# Patient Record
Sex: Male | Born: 1985 | Race: Black or African American | Hispanic: No | Smoking: Current every day smoker
Health system: Southern US, Community
[De-identification: ages and names within clinical notes are randomized; demographics above are authoritative.]

## PROBLEM LIST (undated history)

## (undated) DIAGNOSIS — S022XXA Fracture of nasal bones, initial encounter for closed fracture: Secondary | ICD-10-CM

## (undated) DIAGNOSIS — S02402A Zygomatic fracture, unspecified, initial encounter for closed fracture: Secondary | ICD-10-CM

## (undated) HISTORY — PX: WISDOM TOOTH EXTRACTION: SHX21

## (undated) HISTORY — PX: TENDON REPAIR: SHX5111

---

## 2011-08-13 ENCOUNTER — Encounter (HOSPITAL_COMMUNITY): Payer: Self-pay | Admitting: *Deleted

## 2011-08-13 ENCOUNTER — Emergency Department (HOSPITAL_COMMUNITY)
Admission: EM | Admit: 2011-08-13 | Discharge: 2011-08-13 | Disposition: A | Discharge status: Home / Self Care | Payer: Self-pay | Attending: Emergency Medicine | Admitting: Emergency Medicine

## 2011-08-13 DIAGNOSIS — K047 Periapical abscess without sinus: Secondary | ICD-10-CM

## 2011-08-13 DIAGNOSIS — K044 Acute apical periodontitis of pulpal origin: Secondary | ICD-10-CM | POA: Insufficient documentation

## 2011-08-13 DIAGNOSIS — K089 Disorder of teeth and supporting structures, unspecified: Secondary | ICD-10-CM | POA: Insufficient documentation

## 2011-08-13 DIAGNOSIS — K029 Dental caries, unspecified: Secondary | ICD-10-CM | POA: Insufficient documentation

## 2011-08-13 MED ORDER — PENICILLIN V POTASSIUM 500 MG PO TABS: 500.0000 mg | ORAL_TABLET | Freq: Four times a day (QID) | ORAL | Status: DC

## 2011-08-13 MED ORDER — HYDROCODONE-ACETAMINOPHEN 5-325 MG PO TABS: 1.0000 | ORAL_TABLET | ORAL | Status: DC | PRN

## 2011-08-13 NOTE — ED Notes (Signed)
Dr Alto Denver made aware that pt is in cdu#9.

## 2011-08-13 NOTE — ED Provider Notes (Signed)
History     CSN: [id number]  Arrival date & time 08/13/11  1415   First MD Initiated Contact with Patient 08/13/11 1451      Chief Complaint  Patient presents with  . Dental Pain    (Consider location/radiation/quality/duration/timing/severity/associated sxs/prior treatment) Patient is a 26 y.o. male presenting with tooth pain. The history is provided by the patient.  Dental PainPrimary symptoms do not include headaches, fever or sore throat.  Additional symptoms include: facial swelling. Additional symptoms do not include: trouble swallowing, drooling and ear pain.  Pt with pain to left lower dentition for approx 1 week with no known dental injury. This morning, reports acutely worsened pain with left sided facial swelling. Denies fever, HA, ear pain, throat swelling, neck stiffness, nausea, or vomiting. Pain worse with chewing, no alleviating factors. No prior treatment. Last dental exam several years ago.  History reviewed. No pertinent past medical history.  History reviewed. No pertinent past surgical history.  History reviewed. No pertinent family history.  History  Substance Use Topics  . Smoking status: Current Everyday Smoker    Types: Cigarettes  . Smokeless tobacco: Not on file  . Alcohol Use: Yes     occ      Review of Systems  Constitutional: Negative for fever and chills.  HENT: Positive for facial swelling, neck pain and dental problem. Negative for ear pain, sore throat, drooling, trouble swallowing, neck stiffness and voice change.   Eyes: Negative for pain.  Cardiovascular: Negative for chest pain.  Gastrointestinal: Negative for nausea and vomiting.  Skin: Negative for color change and rash.  Neurological: Negative for headaches.    Allergies  Review of patient's allergies indicates no known allergies.  Home Medications   Current Outpatient Rx  Name Route Sig Dispense Refill  . ADVIL PO Oral Take 10 tablets by mouth once.      BP 125/66   Pulse 90  Temp(Src) 98.1 F (36.7 C) (Oral)  Resp 18  SpO2 99%  Physical Exam  Nursing note and vitals reviewed. Constitutional: He is oriented to person, place, and time. He appears well-developed and well-nourished. No distress.  HENT:  Head: Normocephalic and atraumatic. No trismus in the jaw.  Right Ear: External ear normal.  Left Ear: External ear normal.  Nose: Nose normal.  Mouth/Throat: Mucous membranes are normal. Dental caries present. No uvula swelling. No oropharyngeal exudate or tonsillar abscesses.    Eyes: Pupils are equal, round, and reactive to light. Right eye exhibits no discharge. Left eye exhibits no discharge.  Neck: Normal range of motion. Neck supple.       Slight tenderness without swelling over left tonsillar node  Cardiovascular: Normal rate and regular rhythm.   Pulmonary/Chest: Effort normal. No respiratory distress.  Abdominal: He exhibits no distension. There is no tenderness.  Musculoskeletal: He exhibits no edema and no tenderness.  Lymphadenopathy:    He has no cervical adenopathy.  Neurological: He is alert and oriented to person, place, and time. No cranial nerve deficit.  Skin: Skin is warm and dry. No erythema.  Psychiatric: He has a normal mood and affect.    ED Course  Procedures (including critical care time)  Labs Reviewed - No data to display No results found.   1. Dental infection       MDM  Dental pain with likely infection. Although subjective facial swelling, none can be appreciated on exam. Pt is non-toxic appearing. No s/s of ludwig angina. Will give rx for PCN and pain  medication. Advised close dental follow-up, discussed risks of not following up. Pt and family voice understanding. Return precautions discussed.        Shaaron Adler, [place] 08/13/11 [phone]

## 2011-08-13 NOTE — Discharge Instructions (Signed)
As we discussed, you should take 400mg  of ibuprofen 3 times each day with food initially for your pain. If you need more medication after that, you can use the hydrocodone-acetaminophen as well. Do not drive, operate machinery, or make legal decisions while taking this pain medication.  Take the antibiotic as directed.  Follow-up closely with a dentist as we discussed. The on-call dentist for today is listed above. If he cannot see you, use the resources provided below to obtain care.   Dental Assistance If the dentist on-call cannot see you, please use the resources below:   Patients with Medicaid: [facility] [id number] [address], [phone], [phone]  If unable to pay, or uninsured, contact [facility] [phone]) or [facility] Department [phone] in [place], [phone] in [facility]) to become qualified for the adult dental clinic  Other Low-Cost Community Dental Services: [facility]- [address] Natasha Bence [place], [place], [address]    [phone], Ext. [phone]    2nd and 4th Thursday of the month at 6:30am    10 clients each day by appointment, can sometimes see walk-in     patients if someone does not show for an appointment [facility]- [address]ins [place], [place], [address]    [phone] [facility] [address] [place], [place], [address]    [phone]  [facility] Health Department- [phone] [facility] Health Department- [phone] [facility] [phone]         Abscessed Tooth A tooth abscess is a collection of infected fluid (pus) from a bacterial infection in the inner part of the tooth (pulp). It usually occurs at the end of the tooth's root.  CAUSES   A very bad cavity (extensive tooth decay).   Trauma to the tooth, such as a broken or chipped tooth, that allows bacteria to enter into the pulp.  SYMPTOMS  Severe pain in and around the  infected tooth.   Swelling and redness around the abscessed tooth or in the mouth or face.   Tenderness.   Pus drainage.   Bad breath.   Bitter taste in the mouth.   Difficulty swallowing.   Difficulty opening the mouth.   Feeling sick to your stomach (nauseous).   Vomiting.   Chills.   Swollen neck glands.  DIAGNOSIS  A medical and dental history will be taken.   An examination will be performed by tapping on the abscessed tooth.   X-rays may be taken of the tooth to identify the abscess.  TREATMENT The goal of treatment is to eliminate the infection.   You may be prescribed antibiotic medicine to stop the infection from spreading.   A root canal may be performed to save the tooth. If the tooth cannot be saved, it may be pulled (extracted) and the abscess may be drained.  HOME CARE INSTRUCTIONS  Only take over-the-counter or prescription medicines for pain, fever, or discomfort as directed by your caregiver.   Do not drive after taking pain medicine (narcotics).   Rinse your mouth (gargle) often with salt water ( tsp salt in 8 oz of warm water) to relieve pain or swelling.   Do not apply heat to the outside of your face.   Return to your dentist for further treatment as directed.  SEEK IMMEDIATE DENTAL CARE IF:  You have a temperature by mouth above 102 F (38.9 C), not controlled by medicine.   You have chills or a  very bad headache.   You have problems breathing or swallowing.   Your have trouble opening your mouth.   You develop swelling in the neck or around the eye.   Your pain is not helped by medicine.   Your pain is getting worse instead of better.  Document Released: 05/08/2005 Document Revised: 04/27/2011 Document Reviewed: 08/16/2010 [facility] Patient Information 2012 [place], [place].

## 2011-08-13 NOTE — ED Notes (Signed)
Reports having left lower toothache with swelling, airway is intact, resp e/u.

## 2011-08-15 NOTE — ED Provider Notes (Signed)
Medical screening examination/treatment/procedure(s) were performed by non-physician practitioner and as supervising physician I was immediately available for consultation/collaboration.  Shamiyah Ngu, MD 08/15/11 0658 

## 2011-08-19 ENCOUNTER — Emergency Department (HOSPITAL_COMMUNITY): Payer: Self-pay

## 2011-08-19 ENCOUNTER — Emergency Department (HOSPITAL_COMMUNITY)
Admission: EM | Admit: 2011-08-19 | Discharge: 2011-08-19 | Disposition: A | Discharge status: Home / Self Care | Payer: Self-pay | Attending: Emergency Medicine | Admitting: Emergency Medicine

## 2011-08-19 ENCOUNTER — Encounter (HOSPITAL_COMMUNITY): Payer: Self-pay | Admitting: Emergency Medicine

## 2011-08-19 DIAGNOSIS — L089 Local infection of the skin and subcutaneous tissue, unspecified: Secondary | ICD-10-CM | POA: Insufficient documentation

## 2011-08-19 DIAGNOSIS — K029 Dental caries, unspecified: Secondary | ICD-10-CM | POA: Insufficient documentation

## 2011-08-19 DIAGNOSIS — R51 Headache: Secondary | ICD-10-CM | POA: Insufficient documentation

## 2011-08-19 DIAGNOSIS — T8140XA Infection following a procedure, unspecified, initial encounter: Secondary | ICD-10-CM

## 2011-08-19 LAB — BASIC METABOLIC PANEL WITH GFR
BUN: 11 mg/dL (ref 6–23)
Chloride: 103 meq/L (ref 96–112)
Creatinine, Ser: 1 mg/dL (ref 0.50–1.35)
GFR calc Af Amer: >90 mL/min (ref >90)

## 2011-08-19 LAB — CBC
HCT: 39.1 % (ref 39.0–52.0)
Hemoglobin: 13.1 g/dL (ref 13.0–17.0)
MCH: 29.8 pg (ref 26.0–34.0)
MCHC: 33.5 g/dL (ref 30.0–36.0)
MCV: 89.1 fL (ref 78.0–100.0)
Platelets: 322 10*3/uL (ref 150–400)
RBC: 4.39 MIL/uL (ref 4.22–5.81)
RDW: 13.6 % (ref 11.5–15.5)
WBC: 14.4 10*3/uL — ABNORMAL HIGH (ref 4.0–10.5)

## 2011-08-19 LAB — DIFFERENTIAL
Basophils Absolute: 0 10*3/uL (ref 0.0–0.1)
Basophils Relative: 0 % (ref 0–1)
Eosinophils Absolute: 0.1 10*3/uL (ref 0.0–0.7)
Eosinophils Relative: 1 % (ref 0–5)
Lymphocytes Relative: 16 % (ref 12–46)
Lymphs Abs: 2.3 10*3/uL (ref 0.7–4.0)
Monocytes Absolute: 2.1 K/uL — ABNORMAL HIGH (ref 0.1–1.0)
Monocytes Relative: 15 % — ABNORMAL HIGH (ref 3–12)
Neutro Abs: 9.8 K/uL — ABNORMAL HIGH (ref 1.7–7.7)
Neutrophils Relative %: 68 % (ref 43–77)

## 2011-08-19 LAB — BASIC METABOLIC PANEL
CO2: 26 mEq/L (ref 19–32)
Calcium: 9.5 mg/dL (ref 8.4–10.5)
GFR calc non Af Amer: >90 mL/min (ref >90)
Glucose, Bld: 89 mg/dL (ref 70–99)
Potassium: 3.8 mEq/L (ref 3.5–5.1)
Sodium: 140 mEq/L (ref 135–145)

## 2011-08-19 MED ORDER — OXYCODONE-ACETAMINOPHEN 5-325 MG PO TABS: 1.0000 | ORAL_TABLET | ORAL | Status: AC | PRN

## 2011-08-19 MED ORDER — IOHEXOL 300 MG/ML  SOLN
80.0000 mL | Freq: Once | INTRAMUSCULAR | Status: AC | PRN
  Administered 2011-08-19: 80 mL via INTRAVENOUS

## 2011-08-19 MED ORDER — MORPHINE SULFATE 4 MG/ML IJ SOLN
4.0000 mg | Freq: Once | INTRAMUSCULAR | Status: AC
  Administered 2011-08-19: 4 mg via INTRAVENOUS
  Filled 2011-08-19: qty 1

## 2011-08-19 MED ORDER — HYDROMORPHONE HCL PF 2 MG/ML IJ SOLN
2.0000 mg | Freq: Once | INTRAMUSCULAR | Status: AC
  Administered 2011-08-19: 2 mg via INTRAMUSCULAR
  Filled 2011-08-19: qty 1

## 2011-08-19 MED ORDER — SODIUM CHLORIDE 0.9 % IV SOLN
Freq: Once | INTRAVENOUS | Status: AC
  Administered 2011-08-19: 14:00:00 via INTRAVENOUS

## 2011-08-19 MED ORDER — CLINDAMYCIN HCL 300 MG PO CAPS: 300.0000 mg | ORAL_CAPSULE | Freq: Four times a day (QID) | ORAL | Status: AC

## 2011-08-19 MED ORDER — HYDROMORPHONE HCL PF 1 MG/ML IJ SOLN
1.0000 mg | Freq: Once | INTRAMUSCULAR | Status: AC
  Administered 2011-08-19: 1 mg via INTRAVENOUS
  Filled 2011-08-19: qty 1

## 2011-08-19 MED ORDER — METRONIDAZOLE 500 MG PO TABS: 500.0000 mg | ORAL_TABLET | Freq: Two times a day (BID) | ORAL | Status: AC

## 2011-08-19 MED ORDER — CLINDAMYCIN PHOSPHATE 900 MG/50ML IV SOLN
900.0000 mg | Freq: Once | INTRAVENOUS | Status: AC
  Administered 2011-08-19: 900 mg via INTRAVENOUS
  Filled 2011-08-19: qty 50

## 2011-08-19 MED ORDER — HYDROMORPHONE HCL PF 1 MG/ML IJ SOLN: 1.0000 mg | Freq: Once | INTRAMUSCULAR | Status: DC

## 2011-08-19 NOTE — ED Provider Notes (Signed)
3:53 PM  Pt is in CDU holding for maxillofacial CT.  Patient with recent tooth extraction, now with swelling and pain along left jaw.  Reports pain with swallowing but no difficulty swallowing or breathing.  Pain currently 9/10.  On exam, pt is A&Ox4, NAD, speaking easily in full sentences. large area of swelling over left mandible, pt unable to open jaw very far for exam, RRR, CTAB no stridor.    5:33 PM Discussed results with Dr Oletta Lamas and with patient.  Per my discussion with Dr Oletta Lamas, plan is for d/c home with clindamycin and flagyl, pain medication, and dental follow up on Monday (Dr Oswaldo Done).    Patient notes that he did smoke following his procedure, despite being told not to by the dentist.  I have advised patient that the antibiotic clindamycin is expensive and patient states that this will not be a problem. Discussed plan with patient including strict return precautions.  Patient verbalizes understanding and agrees with plan.  Patient tolerating his oral secretions without difficulty, drinking in the ED.  Advised to maintain hydration, take medication as directed, and call dentist first thing Monday morning for an appointment that day.  Pt is to return for any worsening condition, especially difficulty swallowing or breathing - pt advised to dial [phone] if this occurs.  Patient verbalizes understanding and agrees with plan.    Dillard Cannon [place], [place] 08/19/11 [phone]

## 2011-08-19 NOTE — ED Notes (Signed)
Pt. Stated, i had my tooth pulled on Tuesday, and I've had facial swelling and pain

## 2011-08-19 NOTE — Discharge Instructions (Signed)
Do not smoke until your dentist gives you permission to resume smoking.  This may cause problems with healing.  Take the medication as prescribed.  Call Dr Oswaldo Done first thing Monday morning to be seen the same day.  Please let the office know the ER doctor spoke with him while you were here to arrange this.  Use the pain medication as needed.  Do not take additional tylenol while taking the prescribed pain medication.  This is to be taken instead of what you were prescribed by your dentist.  Taking both could result in an overdose of tylenol.  Please discuss all medications with your pharmacist.  You may take ibuprofen in addition to the prescribed pain medication.  You may take 800mg  three times daily with meals (800mg  is usually 4 over the counter pills).  Return to the ER immediately if you develop high fevers while on antibiotics, uncontrolled pain, increased swelling that makes it difficult to breath or swallow your own saliva, or you are unable to tolerate fluids by mouth.  You may return to the ER at any time for worsening condition or any new symptoms that concern you.

## 2011-08-19 NOTE — ED Notes (Signed)
Pt reports taking Amoxicillin 500 mg tabs, pt has 2 tabs left

## 2011-08-19 NOTE — ED Provider Notes (Signed)
History    CSN: [id number]  Arrival date & time 08/19/11  1311   First MD Initiated Contact with Patient 08/19/11 1340      Chief Complaint Patient presents with . Oral Swelling   (Consider location/radiation/quality/duration/timing/severity/associated sxs/prior treatment) HPI Comments: Patient apparently was seen in the emergency department on March 24 with dental pain. He had swelling and chills with pain focal to his posterior lower left wisdom tooth region. He was started on penicillin. He followed up with a dentist and had that tooth extracted. He reports that he was switched to amoxicillin and also given a prescription for pain medication and steroids. The patient reports that the swelling and pain was improving however since Thursday, the swelling has gotten much worse and now his entire left cheek and jaw area is extremely swollen and it is difficult to open his mouth. He reports he can't swallow it he leans his head to the right side. He denies shortness of breath. He reports some chills but unsure about actual fevers. He has pain and swelling he reports also underneath his chin more to the left side. No coughing, no nausea or vomiting. He reports a small amount of bleeding after he brushes his teeth in that area.  The history is provided by a friend and the patient.    History reviewed. No pertinent past medical history.  History reviewed. No pertinent past surgical history.  History reviewed. No pertinent family history.  History Substance Use Topics . Smoking status: Not on file . Smokeless tobacco: Not on file . Alcohol Use: 1.2 oz/week   2 Cans of beer per week    per week     Review of Systems  Constitutional: Positive for chills.  HENT: Positive for dental problem. Negative for neck pain and neck stiffness.   Respiratory: Negative for cough and shortness of breath.   Gastrointestinal: Negative for nausea, vomiting and abdominal pain.    Allergies Review of  patient's allergies indicates no known allergies.  Home Medications No current outpatient prescriptions on file.  BP 135/68  Pulse 93  Temp(Src) 98.5 F (36.9 C) (Oral)  Resp 20  SpO2 95%  Physical Exam  Nursing note and vitals reviewed. Constitutional: He appears well-developed and well-nourished.  HENT:  Head: Normocephalic and atraumatic.         Some trismus, able to fit 1.5 fingers in between teeth with mouth opening  Cardiovascular: Normal rate.   Pulmonary/Chest: Effort normal. No stridor. No respiratory distress. He has no wheezes. He has no rales.  Abdominal: Soft.  Lymphadenopathy:    He has no cervical adenopathy.  Neurological: He is alert.  Skin: Skin is warm.    ED Course Procedures (including critical care time)   Labs Reviewed   No results found.   No diagnosis found.    MDM I discussed case with dentist on call for Dr. Oswaldo Done.  He initially thought he could be given oral flagyl as abx, but I further described degree of swelling and he agrees that CT and IV dose of abx is probably needed.  He suggested that oral surgeon may need to be involved in this case pending results.  Will place pt in CDU holding for IV meds, IVF's, and CT of maxillofacial with contrast.  Dr. Chales Salmon is on call for oral surgery.    Gavin Pound. Quinnten Calvin, MD 08/19/11 1517

## 2011-08-21 NOTE — ED Provider Notes (Signed)
Medical screening examination/treatment/procedure(s) were conducted as a shared visit with non-physician practitioner(s) and myself.  I personally evaluated the patient during the encounter  Please see my original H&P.  Gavin Pound. Phillippe Orlick, MD 08/21/11 1734

## 2017-05-28 ENCOUNTER — Emergency Department (HOSPITAL_BASED_OUTPATIENT_CLINIC_OR_DEPARTMENT_OTHER)
Admission: EM | Admit: 2017-05-28 | Discharge: 2017-05-29 | Disposition: A | Discharge status: Home / Self Care | Payer: Self-pay | Attending: Emergency Medicine | Admitting: Emergency Medicine

## 2017-05-28 ENCOUNTER — Other Ambulatory Visit: Payer: Self-pay

## 2017-05-28 ENCOUNTER — Emergency Department (HOSPITAL_BASED_OUTPATIENT_CLINIC_OR_DEPARTMENT_OTHER): Payer: Self-pay

## 2017-05-28 ENCOUNTER — Encounter (HOSPITAL_BASED_OUTPATIENT_CLINIC_OR_DEPARTMENT_OTHER): Payer: Self-pay

## 2017-05-28 DIAGNOSIS — R197 Diarrhea, unspecified: Secondary | ICD-10-CM | POA: Insufficient documentation

## 2017-05-28 DIAGNOSIS — R112 Nausea with vomiting, unspecified: Secondary | ICD-10-CM

## 2017-05-28 DIAGNOSIS — R109 Unspecified abdominal pain: Secondary | ICD-10-CM

## 2017-05-28 DIAGNOSIS — R1111 Vomiting without nausea: Secondary | ICD-10-CM | POA: Insufficient documentation

## 2017-05-28 LAB — COMPREHENSIVE METABOLIC PANEL
ALT: 66 U/L — ABNORMAL HIGH (ref 17–63)
ANION GAP: 8 (ref 5–15)
AST: 31 U/L (ref 15–41)
Albumin: 4.1 g/dL (ref 3.5–5.0)
Alkaline Phosphatase: 43 U/L (ref 38–126)
BUN: 13 mg/dL (ref 6–20)
CHLORIDE: 103 mmol/L (ref 101–111)
CO2: 28 mmol/L (ref 22–32)
CREATININE: 1.2 mg/dL (ref 0.61–1.24)
Calcium: 8.9 mg/dL (ref 8.9–10.3)
GFR calc non Af Amer: >60 mL/min (ref >60)
Glucose, Bld: 94 mg/dL (ref 65–99)
Potassium: 3.6 mmol/L (ref 3.5–5.1)
SODIUM: 139 mmol/L (ref 135–145)
Total Bilirubin: 0.6 mg/dL (ref 0.3–1.2)
Total Protein: 7.6 g/dL (ref 6.5–8.1)

## 2017-05-28 LAB — URINALYSIS, ROUTINE W REFLEX MICROSCOPIC
Bilirubin Urine: NEGATIVE
Glucose, UA: NEGATIVE mg/dL
Ketones, ur: NEGATIVE mg/dL
Leukocytes, UA: NEGATIVE
NITRITE: NEGATIVE
PROTEIN: 30 mg/dL — AB
Specific Gravity, Urine: 1.02 (ref 1.005–1.030)
pH: 6.5 (ref 5.0–8.0)

## 2017-05-28 LAB — CBC WITH DIFFERENTIAL/PLATELET
Basophils Absolute: 0 10*3/uL (ref 0.0–0.1)
Basophils Relative: 0 %
Eosinophils Absolute: 0.3 10*3/uL (ref 0.0–0.7)
Eosinophils Relative: 3 %
HEMATOCRIT: 44.2 % (ref 39.0–52.0)
HEMOGLOBIN: 14.7 g/dL (ref 13.0–17.0)
LYMPHS ABS: 1.5 10*3/uL (ref 0.7–4.0)
LYMPHS PCT: 18 %
MCH: 30 pg (ref 26.0–34.0)
MCHC: 33.3 g/dL (ref 30.0–36.0)
MCV: 90.2 fL (ref 78.0–100.0)
Monocytes Absolute: 1.2 10*3/uL — ABNORMAL HIGH (ref 0.1–1.0)
Monocytes Relative: 14 %
NEUTROS PCT: 65 %
Neutro Abs: 5.6 10*3/uL (ref 1.7–7.7)
Platelets: 199 10*3/uL (ref 150–400)
RBC: 4.9 MIL/uL (ref 4.22–5.81)
RDW: 14 % (ref 11.5–15.5)
WBC: 8.6 10*3/uL (ref 4.0–10.5)

## 2017-05-28 LAB — URINALYSIS, MICROSCOPIC (REFLEX): WBC, UA: NONE SEEN WBC/hpf (ref 0–5)

## 2017-05-28 LAB — LIPASE, BLOOD: Lipase: 20 U/L (ref 11–51)

## 2017-05-28 MED ORDER — ONDANSETRON HCL 4 MG/2ML IJ SOLN
4.0000 mg | Freq: Once | INTRAMUSCULAR | Status: AC
  Administered 2017-05-28: 4 mg via INTRAVENOUS
  Filled 2017-05-28: qty 2

## 2017-05-28 MED ORDER — KETOROLAC TROMETHAMINE 30 MG/ML IJ SOLN
30.0000 mg | Freq: Once | INTRAMUSCULAR | Status: AC
Start: 2017-05-28 — End: 2017-05-28
  Administered 2017-05-28: 30 mg via INTRAVENOUS
  Filled 2017-05-28: qty 1

## 2017-05-28 MED ORDER — SODIUM CHLORIDE 0.9 % IV BOLUS (SEPSIS)
1000.0000 mL | Freq: Once | INTRAVENOUS | Status: AC
  Administered 2017-05-28: 1000 mL via INTRAVENOUS

## 2017-05-28 NOTE — ED Provider Notes (Signed)
[facility] EMERGENCY DEPARTMENT Provider Note   CSN: [id number] Arrival date & time: 05/28/17  1723     History   Chief Complaint Chief Complaint  Patient presents with  . Emesis    HPI Jonathon Mendez is a 32 y.o. male who presents with 4 days of generalized abdominal pain, generalized malaise, nausea/vomiting and diarrhea.  Patient reports that since onset of symptoms, he has had multiple episodes of nonbloody, nonbilious vomiting.  He reports that today he only had one.  He reports decreased appetite secondary to symptoms.  Patient reports additionally he has had some episodes of diarrhea.  He denies any blood present and diarrhea.  He has not had any episodes today.  Patient reports that he felt febrile yesterday and measured a temp of 101.0.  He reports that fever has improved and he has not been febrile since yesterday.  Patient reports that abdominal pain is generalized.  He describes it as a dull ache.  He denies any alleviating or aggravating factors.  Patient denies any sore throat, chest pain, difficulty breathing, dysuria, hematuria.   The history is provided by the patient.    History reviewed. No pertinent past medical history.  There are no active problems to display for this patient.   Past Surgical History:  Procedure Laterality Date  . WISDOM TOOTH EXTRACTION         Home Medications    Prior to Admission medications   Medication Sig Start Date End Date Taking? Authorizing Provider  dicyclomine (BENTYL) 20 MG tablet Take 1 tablet (20 mg total) by mouth 2 (two) times daily. 05/29/17   Maxwell Caul, PA-C  ondansetron (ZOFRAN) 4 MG tablet Take 1 tablet (4 mg total) by mouth every 6 (six) hours. 05/29/17   Maxwell Caul, PA-C    Family History No family history on file.  Social History Social History   Tobacco Use  . Smoking status: Current Every Day Smoker    Packs/day: 0.50    Types: Cigarettes  . Smokeless tobacco: Never Used    Substance Use Topics  . Alcohol use: Yes    Comment: occ  . Drug use: No     Allergies   Patient has no known allergies.   Review of Systems Review of Systems  Constitutional: Positive for fever (Resolved).  Respiratory: Negative for cough and shortness of breath.   Cardiovascular: Negative for chest pain.  Gastrointestinal: Positive for abdominal pain, diarrhea, nausea and vomiting.  Genitourinary: Negative for dysuria and hematuria.  Neurological: Negative for headaches.     Physical Exam Updated Vital Signs BP (!) 106/57 (BP Location: Right Arm)   Pulse 70   Temp 98.2 F (36.8 C) (Oral)   Resp 16   Ht 5\' 9"  (1.753 m)   Wt 79.4 kg (175 lb)   SpO2 100%   BMI 25.84 kg/m   Physical Exam  Constitutional: He is oriented to person, place, and time. He appears well-developed and well-nourished.  Sitting comfortably on examination table  HENT:  Head: Normocephalic and atraumatic.  Mouth/Throat: Oropharynx is clear and moist and mucous membranes are normal.  Eyes: Conjunctivae, EOM and lids are normal. Pupils are equal, round, and reactive to light.  Neck: Full passive range of motion without pain.  Cardiovascular: Normal rate, regular rhythm, normal heart sounds and normal pulses. Exam reveals no gallop and no friction rub.  No murmur heard. Pulmonary/Chest: Effort normal and breath sounds normal.  No evidence of respiratory distress. Able to  speak in full sentences without difficulty.  Abdominal: Soft. Normal appearance. There is generalized tenderness. There is no rigidity, no guarding, no CVA tenderness and no tenderness at McBurney's point. Hernia confirmed negative in the right inguinal area and confirmed negative in the left inguinal area.  Abdomen is soft, nondistended.  Patient does have diffuse generalized tenderness.  Patient does have some CVA tenderness, most notably on the right.  No rigidity or guarding.  No peritoneal signs.  No tenderness to McBurney's  point.  Genitourinary: Testes normal and penis normal. Right testis shows no swelling and no tenderness. Left testis shows no swelling and no tenderness. Circumcised.  Genitourinary Comments: The exam was performed with a chaperone present. Normal male genitalia. No evidence of rash, ulcers or lesions.   Musculoskeletal: Normal range of motion.  Neurological: He is alert and oriented to person, place, and time.  Skin: Skin is warm and dry. Capillary refill takes less than 2 seconds.  Psychiatric: He has a normal mood and affect. His speech is normal.  Nursing note and vitals reviewed.    ED Treatments / Results  Labs (all labs ordered are listed, but only abnormal results are displayed) Labs Reviewed  URINALYSIS, ROUTINE W REFLEX MICROSCOPIC - Abnormal; Notable for the following components:      Result Value   Hgb urine dipstick MODERATE (*)    Protein, ur 30 (*)    All other components within normal limits  COMPREHENSIVE METABOLIC PANEL - Abnormal; Notable for the following components:   ALT 66 (*)    All other components within normal limits  CBC WITH DIFFERENTIAL/PLATELET - Abnormal; Notable for the following components:   Monocytes Absolute 1.2 (*)    All other components within normal limits  URINALYSIS, MICROSCOPIC (REFLEX) - Abnormal; Notable for the following components:   Bacteria, UA RARE (*)    Squamous Epithelial / LPF 0-5 (*)    All other components within normal limits  LIPASE, BLOOD    EKG  EKG Interpretation None       Radiology Ct Renal Stone Study  Result Date: 05/29/2017 CLINICAL DATA:  Bilateral flank pain. EXAM: CT ABDOMEN AND PELVIS WITHOUT CONTRAST TECHNIQUE: Multidetector CT imaging of the abdomen and pelvis was performed following the standard protocol without IV contrast. COMPARISON:  None. FINDINGS: Lower chest: Lung bases are clear. Hepatobiliary: No focal hepatic lesion allowing for lack contrast. Gallbladder partially distended, no calcified  stone or pericholecystic inflammation. No biliary dilatation. Pancreas: No ductal dilatation or inflammation. Spleen: Normal in size without focal abnormality. Adrenals/Urinary Tract: Normal adrenal glands. No hydronephrosis. No urolithiasis. Both ureters are decompressed without stones along the course. No perinephric edema. Urinary bladder near completely decompressed, no bladder stone. Stomach/Bowel: Stomach is within normal limits. Appendix appears normal. No evidence of bowel wall thickening, distention, or inflammatory changes. Vascular/Lymphatic: Normal caliber abdominal aorta. No enlarged abdominal or pelvic lymph nodes. Reproductive: Prostate is unremarkable. Other: Punctate left pelvic calcification is a phlebolith. No free air. No ascites. Musculoskeletal: There are no acute or suspicious osseous abnormalities. IMPRESSION: No renal stones or obstructive uropathy. No acute abnormality or explanation for flank pain. Electronically Signed   By: Rubye OaksMelanie  Ehinger M.D.   On: 05/29/2017 00:00    Procedures Procedures (including critical care time)  Medications Ordered in ED Medications  dicyclomine (BENTYL) capsule 10 mg (not administered)  sodium chloride 0.9 % bolus 1,000 mL (0 mLs Intravenous Stopped 05/28/17 2243)  ondansetron (ZOFRAN) injection 4 mg (4 mg Intravenous Given 05/28/17 2159)  ketorolac (TORADOL) 30 MG/ML injection 30 mg (30 mg Intravenous Given 05/28/17 2245)     Initial Impression / Assessment and Plan / ED Course  I have reviewed the triage vital signs and the nursing notes.  Pertinent labs & imaging results that were available during my care of the patient were reviewed by me and considered in my medical decision making (see chart for details).     32 y.o. M who presents for evaluation of abdominal pain, nausea/vomiting and diarrhea.  Emesis is nonbloody, nonbilious.  Diarrhea is nonbloody, nonbilious.  Patient reports decreased appetite secondary to symptoms. Patient is  afebrile, non-toxic appearing, sitting comfortably on examination table. Vital signs reviewed and stable.  Physical exam shows diffuse tenderness palpation to the abdomen.  No focal point.  No peritoneal signs.  Normal GU exam.  Consider acute infectious etiology.  Patient does have some CVA tenderness.  Will obtain urine before deciding on imaging.  History/physical exam not concerning for diverticulitis or appendicitis.  History/physical exam not concerning for testicular torsion.  IVF given for fluid resuscitation. Analgesics provided in the department. Plan to check basic labs.   Labs reviewed.  Lipase unremarkable.  ALT has slight bump.  Urinalysis negative for any acute infectious signs does have some hemoglobin.  CBC unremarkable.  Given the patient has hemoglobin in the urine and did have some CVA tenderness, will plan for CT renal scan for evaluation of kidney stone.  Reevaluation.  Patient reports improvement of pain after analgesics.  Will reassess.  CT renal stone study negative for any acute kidney stones.  Appendix is normal.  No other acute abnormalities.  Discussed results with patient.  Plan to p.o. challenge patient the department.  Patient did not tolerate p.o. without any difficulty.  Repeat abdominal exam shows improved tenderness.  No peritoneal signs noted.  Exam is not concerning for appendicitis or reticulitis.  Vital signs are stable.  Likely due to viral gastro-process.  Plan to send patient home with antiemetics.  Patient instructed follow-up primary care doctor next 24-48 hours for further evaluation. Patient had ample opportunity for questions and discussion. All patient's questions were answered with full understanding. Strict return precautions discussed. Patient expresses understanding and agreement to plan.    Final Clinical Impressions(s) / ED Diagnoses   Final diagnoses:  Nausea vomiting and diarrhea  Abdominal pain, unspecified abdominal location    ED Discharge  Orders        Ordered    ondansetron (ZOFRAN) 4 MG tablet  Every 6 hours     05/29/17 0017    dicyclomine (BENTYL) 20 MG tablet  2 times daily     05/29/17 0019       Maxwell Caul, PA-C 05/29/17 Dola Factor, MD 05/29/17 1807

## 2017-05-28 NOTE — ED Notes (Signed)
Pt c/o flu symptoms since yesterday, has not had any OTC meds for symptoms.  Pt has not vomited or had diarrhea since arrival at the ED.

## 2017-05-28 NOTE — ED Triage Notes (Signed)
C/o n/v/d, fever,body aches-started yesterday-NAD-steady gait

## 2017-05-28 NOTE — ED Notes (Signed)
EDP is now at bedside 

## 2017-05-28 NOTE — ED Notes (Signed)
Patient transported to CT 

## 2017-05-29 MED ORDER — DICYCLOMINE HCL 20 MG PO TABS: 20.0000 mg | ORAL_TABLET | Freq: Two times a day (BID) | ORAL | 0 refills | Status: DC

## 2017-05-29 MED ORDER — DICYCLOMINE HCL 10 MG PO CAPS
10.0000 mg | ORAL_CAPSULE | Freq: Once | ORAL | Status: AC
  Administered 2017-05-29: 10 mg via ORAL
  Filled 2017-05-29: qty 1

## 2017-05-29 MED ORDER — ONDANSETRON HCL 4 MG PO TABS: 4.0000 mg | ORAL_TABLET | Freq: Four times a day (QID) | ORAL | 0 refills | Status: DC

## 2017-05-29 NOTE — Discharge Instructions (Signed)
Take zofran as directed for nausea.   Take bentyl as needed for pain.  Make sure you are drinking plenty of fluids and staying hydrated.  Follow-up with your primary care doctor in the next 24-48 hours for further evaluation.  Return to the emergency department for any worsening abdominal pain, fever, persistent vomiting despite medications, pain with urination, chest pain or difficulty breathing or any other worsening or concerning symptoms.

## 2017-05-29 NOTE — ED Notes (Signed)
Pt tolerated ginger ale, states he feels better

## 2017-05-29 NOTE — ED Notes (Signed)
Pt verbalizes understanding of d/c instructions and denies any further needs at this time. 

## 2017-08-22 ENCOUNTER — Encounter (HOSPITAL_BASED_OUTPATIENT_CLINIC_OR_DEPARTMENT_OTHER): Payer: Self-pay

## 2017-08-22 ENCOUNTER — Emergency Department (HOSPITAL_BASED_OUTPATIENT_CLINIC_OR_DEPARTMENT_OTHER): Payer: Self-pay

## 2017-08-22 ENCOUNTER — Other Ambulatory Visit: Payer: Self-pay

## 2017-08-22 ENCOUNTER — Emergency Department (HOSPITAL_BASED_OUTPATIENT_CLINIC_OR_DEPARTMENT_OTHER)
Admission: EM | Admit: 2017-08-22 | Discharge: 2017-08-22 | Disposition: A | Discharge status: Home / Self Care | Payer: Self-pay | Attending: Emergency Medicine | Admitting: Emergency Medicine

## 2017-08-22 DIAGNOSIS — S0240FA Zygomatic fracture, left side, initial encounter for closed fracture: Secondary | ICD-10-CM | POA: Insufficient documentation

## 2017-08-22 DIAGNOSIS — Y939 Activity, unspecified: Secondary | ICD-10-CM | POA: Insufficient documentation

## 2017-08-22 DIAGNOSIS — Y999 Unspecified external cause status: Secondary | ICD-10-CM | POA: Insufficient documentation

## 2017-08-22 DIAGNOSIS — Y929 Unspecified place or not applicable: Secondary | ICD-10-CM | POA: Insufficient documentation

## 2017-08-22 DIAGNOSIS — Z79899 Other long term (current) drug therapy: Secondary | ICD-10-CM | POA: Insufficient documentation

## 2017-08-22 DIAGNOSIS — S02402A Zygomatic fracture, unspecified, initial encounter for closed fracture: Secondary | ICD-10-CM

## 2017-08-22 DIAGNOSIS — F17210 Nicotine dependence, cigarettes, uncomplicated: Secondary | ICD-10-CM | POA: Insufficient documentation

## 2017-08-22 DIAGNOSIS — S022XXA Fracture of nasal bones, initial encounter for closed fracture: Secondary | ICD-10-CM

## 2017-08-22 HISTORY — DX: Fracture of nasal bones, initial encounter for closed fracture: S02.2XXA

## 2017-08-22 HISTORY — DX: Zygomatic fracture, unspecified side, initial encounter for closed fracture: S02.402A

## 2017-08-22 MED ORDER — OXYCODONE HCL 5 MG PO TABS
5.0000 mg | ORAL_TABLET | Freq: Once | ORAL | Status: AC
  Administered 2017-08-22: 5 mg via ORAL
  Filled 2017-08-22: qty 1

## 2017-08-22 MED ORDER — IBUPROFEN 800 MG PO TABS
800.0000 mg | ORAL_TABLET | Freq: Once | ORAL | Status: AC
  Administered 2017-08-22: 800 mg via ORAL
  Filled 2017-08-22: qty 1

## 2017-08-22 MED ORDER — TETANUS-DIPHTH-ACELL PERTUSSIS 5-2.5-18.5 LF-MCG/0.5 IM SUSP
0.5000 mL | Freq: Once | INTRAMUSCULAR | Status: AC
  Administered 2017-08-22: 0.5 mL via INTRAMUSCULAR
  Filled 2017-08-22: qty 0.5

## 2017-08-22 MED ORDER — MORPHINE SULFATE 15 MG PO TABS: 15.0000 mg | ORAL_TABLET | ORAL | 0 refills | Status: DC | PRN

## 2017-08-22 MED ORDER — ACETAMINOPHEN 500 MG PO TABS
1000.0000 mg | ORAL_TABLET | Freq: Once | ORAL | Status: AC
  Administered 2017-08-22: 1000 mg via ORAL
  Filled 2017-08-22: qty 2
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
## 2017-08-30 ENCOUNTER — Other Ambulatory Visit: Payer: Self-pay

## 2017-08-30 ENCOUNTER — Encounter (HOSPITAL_BASED_OUTPATIENT_CLINIC_OR_DEPARTMENT_OTHER): Payer: Self-pay | Admitting: *Deleted

## 2017-08-31 ENCOUNTER — Ambulatory Visit: Payer: Self-pay | Admitting: Oral Surgery

## 2017-09-02 ENCOUNTER — Encounter (HOSPITAL_BASED_OUTPATIENT_CLINIC_OR_DEPARTMENT_OTHER): Payer: Self-pay | Admitting: Anesthesiology

## 2017-09-02 NOTE — Anesthesia Preprocedure Evaluation (Deleted)
Anesthesia Evaluation  Patient identified by MRN, date of birth, ID band Patient awake    Reviewed: Allergy & Precautions, H&P , Patient's Chart, lab work & pertinent test results, reviewed documented beta blocker date and time   Airway Mallampati: II  TM Distance: >3 FB Neck ROM: full    Dental no notable dental hx.    Pulmonary Current Smoker,    Pulmonary exam normal breath sounds clear to auscultation       Cardiovascular  Rhythm:regular Rate:Normal     Neuro/Psych    GI/Hepatic   Endo/Other    Renal/GU      Musculoskeletal   Abdominal   Peds  Hematology   Anesthesia Other Findings   Reproductive/Obstetrics                             Anesthesia Physical Anesthesia Plan  ASA: II  Anesthesia Plan: General   Post-op Pain Management:    Induction: Intravenous  PONV Risk Score and Plan:   Airway Management Planned: Oral ETT  Additional Equipment:   Intra-op Plan:   Post-operative Plan: Extubation in OR  Informed Consent: I have reviewed the patients History and Physical, chart, labs and discussed the procedure including the risks, benefits and alternatives for the proposed anesthesia with the patient or authorized representative who has indicated his/her understanding and acceptance.   Dental Advisory Given  Plan Discussed with: CRNA and Surgeon  Anesthesia Plan Comments: (  )        Anesthesia Quick Evaluation  

## 2017-09-03 ENCOUNTER — Ambulatory Visit (HOSPITAL_BASED_OUTPATIENT_CLINIC_OR_DEPARTMENT_OTHER): Admission: RE | Admit: 2017-09-03 | Payer: Self-pay | Source: Ambulatory Visit | Admitting: Oral Surgery

## 2017-09-03 HISTORY — DX: Zygomatic fracture, unspecified side, initial encounter for closed fracture: S02.402A

## 2017-09-03 HISTORY — DX: Fracture of nasal bones, initial encounter for closed fracture: S02.2XXA

## 2017-09-03 SURGERY — OPEN REDUCTION INTERNAL FIXATION (ORIF) FACIAL FRACTURE
Anesthesia: General | Laterality: Left

## 2017-09-03 NOTE — H&P (Signed)
Anesthesia H&P Update: History and Physical Exam reviewed; patient is OK for planned anesthetic and procedure. ? ?
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
## 2017-12-05 ENCOUNTER — Ambulatory Visit
Admission: RE | Admit: 2017-12-05 | Discharge: 2017-12-05 | Disposition: A | Discharge status: Home / Self Care | Payer: Self-pay | Source: Ambulatory Visit | Attending: Nurse Practitioner | Admitting: Nurse Practitioner

## 2017-12-05 ENCOUNTER — Other Ambulatory Visit: Payer: Self-pay | Admitting: Nurse Practitioner

## 2017-12-05 DIAGNOSIS — M545 Low back pain: Secondary | ICD-10-CM

## 2018-05-31 ENCOUNTER — Other Ambulatory Visit: Payer: Self-pay

## 2018-05-31 ENCOUNTER — Emergency Department (HOSPITAL_BASED_OUTPATIENT_CLINIC_OR_DEPARTMENT_OTHER)
Admission: EM | Admit: 2018-05-31 | Discharge: 2018-05-31 | Disposition: A | Discharge status: Home / Self Care | Payer: 59 | Attending: Emergency Medicine | Admitting: Emergency Medicine

## 2018-05-31 ENCOUNTER — Encounter (HOSPITAL_BASED_OUTPATIENT_CLINIC_OR_DEPARTMENT_OTHER): Payer: Self-pay | Admitting: *Deleted

## 2018-05-31 ENCOUNTER — Emergency Department (HOSPITAL_BASED_OUTPATIENT_CLINIC_OR_DEPARTMENT_OTHER): Payer: 59

## 2018-05-31 DIAGNOSIS — Y9389 Activity, other specified: Secondary | ICD-10-CM | POA: Diagnosis not present

## 2018-05-31 DIAGNOSIS — S8992XA Unspecified injury of left lower leg, initial encounter: Secondary | ICD-10-CM

## 2018-05-31 DIAGNOSIS — Y9289 Other specified places as the place of occurrence of the external cause: Secondary | ICD-10-CM | POA: Insufficient documentation

## 2018-05-31 DIAGNOSIS — F17210 Nicotine dependence, cigarettes, uncomplicated: Secondary | ICD-10-CM | POA: Diagnosis not present

## 2018-05-31 DIAGNOSIS — W010XXA Fall on same level from slipping, tripping and stumbling without subsequent striking against object, initial encounter: Secondary | ICD-10-CM | POA: Insufficient documentation

## 2018-05-31 DIAGNOSIS — Y998 Other external cause status: Secondary | ICD-10-CM | POA: Diagnosis not present

## 2018-05-31 MED ORDER — IBUPROFEN 600 MG PO TABS: 600.0000 mg | ORAL_TABLET | Freq: Three times a day (TID) | ORAL | 0 refills | Status: DC | PRN

## 2018-05-31 MED ORDER — IBUPROFEN 400 MG PO TABS
400.0000 mg | ORAL_TABLET | Freq: Once | ORAL | Status: AC
  Administered 2018-05-31: 400 mg via ORAL
  Filled 2018-05-31: qty 1

## 2018-05-31 NOTE — ED Provider Notes (Signed)
[facility] EMERGENCY DEPARTMENT Provider Note   CSN: [id number] Arrival date & time: 05/31/18  2040     History   Chief Complaint Chief Complaint  Patient presents with  . Knee Injury    HPI Aydyn Lizza is a 33 y.o. male.  HPI Presented to the emergency room for evaluation of knee pain.  Patient fell onto his left knee yesterday.  Patient states he was only about a foot or so off the ground.  Since that time he has had persistent pain of his left knee.  It hurts to walk and to bend his knee.  It also hurts to palpate his knee.  He denies any numbness or weakness.  No swelling.  No other complaints. Past Medical History:  Diagnosis Date  . Nasal fracture 08/22/2017  . Zygomatic fracture (HCC) 08/22/2017   left    There are no active problems to display for this patient.   Past Surgical History:  Procedure Laterality Date  . TENDON REPAIR Left    arm  . WISDOM TOOTH EXTRACTION          Home Medications    Prior to Admission medications   Not on File    Family History No family history on file.  Social History Social History   Tobacco Use  . Smoking status: Current Every Day Smoker    Packs/day: 0.50    Years: 5.00    Pack years: 2.50    Types: Cigarettes  . Smokeless tobacco: Never Used  Substance Use Topics  . Alcohol use: Never    Frequency: Never  . Drug use: No     Allergies   Patient has no known allergies.   Review of Systems Review of Systems  All other systems reviewed and are negative.    Physical Exam Updated Vital Signs BP (!) 128/95   Pulse 95   Temp 98.6 F (37 C) (Oral)   Resp 18   Ht 1.753 m (5\' 9" )   Wt 104.3 kg   SpO2 100%   BMI 33.97 kg/m   Physical Exam Vitals signs and nursing note reviewed.  Constitutional:      General: He is not in acute distress.    Appearance: He is well-developed.  HENT:     Head: Normocephalic and atraumatic.     Right Ear: External ear normal.     Left  Ear: External ear normal.  Eyes:     General: No scleral icterus.       Right eye: No discharge.        Left eye: No discharge.     Conjunctiva/sclera: Conjunctivae normal.  Neck:     Musculoskeletal: Neck supple.     Trachea: No tracheal deviation.  Cardiovascular:     Rate and Rhythm: Normal rate.  Pulmonary:     Effort: Pulmonary effort is normal. No respiratory distress.     Breath sounds: No stridor.  Abdominal:     General: There is no distension.  Musculoskeletal:        General: No swelling or deformity.     Left knee: He exhibits no swelling, no effusion and normal patellar mobility. Tenderness found.     Comments: Patient is able to hold his leg extended off the bed  Skin:    General: Skin is warm and dry.     Findings: No rash.  Neurological:     Mental Status: He is alert.     Cranial Nerves: Cranial nerve  deficit: no gross deficits.      ED Treatments / Results   Radiology Dg Knee Complete 4 Views Left  Result Date: 05/31/2018 CLINICAL DATA:  Patient fell at work yesterday landing on concrete and injuring the left knee. EXAM: LEFT KNEE - COMPLETE 4+ VIEW COMPARISON:  None. FINDINGS: No evidence of fracture, dislocation, or joint effusion. No evidence of arthropathy or other focal bone abnormality. Soft tissues are unremarkable. IMPRESSION: Negative. Electronically Signed   By: Tollie Eth M.D.   On: 05/31/2018 21:12    Procedures Procedures (including critical care time)  Medications Ordered in ED Medications  ibuprofen (ADVIL,MOTRIN) tablet 400 mg (400 mg Oral Given 05/31/18 2050)     Initial Impression / Assessment and Plan / ED Course  I have reviewed the triage vital signs and the nursing notes.  Pertinent labs & imaging results that were available during my care of the patient were reviewed by me and considered in my medical decision making (see chart for details).   Patient presented to the emergency room for evaluation of knee pain.  No signs of  patellar or quadriceps rupture.  No wound to suggest infection.  Patient is diffusely tender.  Symptoms are most likely related to sprain and contusion.  Discussed supportive measures.  Follow-up with orthopedics if not improving  Final Clinical Impressions(s) / ED Diagnoses   Final diagnoses:  Injury of left knee, initial encounter    ED Discharge Orders    None       Linwood Dibbles, MD 05/31/18 2247

## 2018-05-31 NOTE — Discharge Instructions (Addendum)
Take ibuprofen as needed and apply ice to help with the pain, follow-up with a primary care doctor, sports medicine doctor or orthopedic doctor if the symptoms are not improving in the next week or two

## 2018-05-31 NOTE — ED Triage Notes (Signed)
Pain and swelling to his left knee. He fell yesterday and landed on his knee.

## 2019-03-13 ENCOUNTER — Other Ambulatory Visit: Payer: Self-pay

## 2019-03-13 DIAGNOSIS — Z20822 Contact with and (suspected) exposure to covid-19: Secondary | ICD-10-CM

## 2019-03-14 LAB — NOVEL CORONAVIRUS, NAA: SARS-CoV-2, NAA: NOT DETECTED

## 2019-05-27 ENCOUNTER — Other Ambulatory Visit: Payer: Self-pay | Admitting: Cardiology

## 2019-05-27 DIAGNOSIS — Z20822 Contact with and (suspected) exposure to covid-19: Secondary | ICD-10-CM

## 2019-05-29 LAB — NOVEL CORONAVIRUS, NAA: SARS-CoV-2, NAA: NOT DETECTED

## 2019-06-05 ENCOUNTER — Other Ambulatory Visit: Payer: Self-pay

## 2019-06-05 DIAGNOSIS — Z20822 Contact with and (suspected) exposure to covid-19: Secondary | ICD-10-CM

## 2019-06-06 LAB — NOVEL CORONAVIRUS, NAA: SARS-CoV-2, NAA: NOT DETECTED

## 2019-08-05 ENCOUNTER — Ambulatory Visit (HOSPITAL_COMMUNITY)
Admission: EM | Admit: 2019-08-05 | Discharge: 2019-08-05 | Disposition: A | Discharge status: Home / Self Care | Payer: 59 | Attending: Family Medicine | Admitting: Family Medicine

## 2019-08-05 ENCOUNTER — Other Ambulatory Visit: Payer: Self-pay

## 2019-08-05 ENCOUNTER — Encounter (HOSPITAL_COMMUNITY): Payer: Self-pay

## 2019-08-05 ENCOUNTER — Ambulatory Visit (INDEPENDENT_AMBULATORY_CARE_PROVIDER_SITE_OTHER): Payer: 59

## 2019-08-05 DIAGNOSIS — M25562 Pain in left knee: Secondary | ICD-10-CM | POA: Diagnosis not present

## 2019-08-05 DIAGNOSIS — M25462 Effusion, left knee: Secondary | ICD-10-CM | POA: Diagnosis not present

## 2019-08-05 MED ORDER — PREDNISONE 10 MG (21) PO TBPK: ORAL_TABLET | ORAL | 0 refills | Status: DC

## 2019-08-05 NOTE — ED Triage Notes (Signed)
Pt states he started having left knee pain last year, but it went away for awhile, then came back a few months ago. Pt states the last 2 days the knee pain has gotten worse. Pt denies injuring knee. He states when he walked 2 days ago he heard a "click" then his knee gave out on him, but he was able to catch himself. Pt was limping to exam room. Pt has 1+ edema of left knee. Pt has 2+ left pedal pulse, pt able to move extremity, extremity warm to touch.

## 2019-08-05 NOTE — ED Provider Notes (Signed)
[facility]    CSN: [id number] Arrival date & time: 08/05/19  8756      History   Chief Complaint Chief Complaint  Patient presents with  . Knee Pain    HPI Jonathon Mendez is a 34 y.o. male.   Patient is a 34 year old male that presents today with left knee pain.  This has been ongoing issue for him intermittently over the past year.  Reporting episodes that last a few days and typically resolve.  Over the last 2 days the pain has gotten worse.  Denies any injuries to the knee.  Reporting he is hearing clicking with walking and having instability at times.  Pain is worse with walking and going up stairs. Patient does Holiday representative for employment. There is swelling to the knee.  No calf pain swelling or tenderness.  ROS per HPI      Past Medical History:  Diagnosis Date  . Nasal fracture 08/22/2017  . Zygomatic fracture (HCC) 08/22/2017   left    There are no problems to display for this patient.   Past Surgical History:  Procedure Laterality Date  . TENDON REPAIR Left    arm  . WISDOM TOOTH EXTRACTION         Home Medications    Prior to Admission medications   Medication Sig Start Date End Date Taking? Authorizing Provider  ibuprofen (ADVIL,MOTRIN) 600 MG tablet Take 1 tablet (600 mg total) by mouth every 8 (eight) hours as needed. 05/31/18   Linwood Dibbles, MD  predniSONE (STERAPRED UNI-PAK 21 TAB) 10 MG (21) TBPK tablet 6 tabs for 1 day, then 5 tabs for 1 das, then 4 tabs for 1 day, then 3 tabs for 1 day, 2 tabs for 1 day, then 1 tab for 1 day 08/05/19   Janace Aris, NP    Family History No family history on file.  Social History Social History   Tobacco Use  . Smoking status: Current Every Day Smoker    Packs/day: 0.50    Years: 5.00    Pack years: 2.50    Types: Cigarettes  . Smokeless tobacco: Never Used  Substance Use Topics  . Alcohol use: Yes    Alcohol/week: 3.0 standard drinks    Types: 3 Cans of beer per week   Comment: 24oz can  . Drug use: No     Allergies   Patient has no known allergies.   Review of Systems Review of Systems   Physical Exam Triage Vital Signs ED Triage Vitals  Enc Vitals Group     BP 08/05/19 0904 135/84     Pulse Rate 08/05/19 0904 98     Resp 08/05/19 0904 18     Temp 08/05/19 0904 98.4 F (36.9 C)     Temp Source 08/05/19 0904 Oral     SpO2 08/05/19 0904 100 %     Weight 08/05/19 0905 229 lb 15 oz (104.3 kg)     Height 08/05/19 0905 5\' 10"  (1.778 m)     Head Circumference --      Peak Flow --      Pain Score 08/05/19 0904 6     Pain Loc --      Pain Edu? --      Excl. in GC? --    No data found.  Updated Vital Signs BP 135/84 (BP Location: Left Arm)   Pulse 98   Temp 98.4 F (36.9 C) (Oral)   Resp 18   Ht  5\' 10"  (1.778 m)   Wt 229 lb 15 oz (104.3 kg)   SpO2 100%   BMI 32.99 kg/m   Visual Acuity Right Eye Distance:   Left Eye Distance:   Bilateral Distance:    Right Eye Near:   Left Eye Near:    Bilateral Near:     Physical Exam Vitals and nursing note reviewed.  Constitutional:      Appearance: Normal appearance.  HENT:     Head: Normocephalic and atraumatic.     Nose: Nose normal.  Eyes:     Conjunctiva/sclera: Conjunctivae normal.  Pulmonary:     Effort: Pulmonary effort is normal.  Musculoskeletal:        General: Swelling and tenderness present.     Cervical back: Normal range of motion.     Right knee: Swelling, effusion and bony tenderness present. No deformity, erythema, ecchymosis or lacerations. Decreased range of motion. Tenderness present over the lateral joint line, MCL and PCL. No LCL laxity, MCL laxity, ACL laxity or PCL laxity. Normal alignment, normal meniscus and normal patellar mobility.  Skin:    General: Skin is warm and dry.  Neurological:     Mental Status: He is alert.  Psychiatric:        Mood and Affect: Mood normal.      UC Treatments / Results  Labs (all labs ordered are listed, but only  abnormal results are displayed) Labs Reviewed - No data to display  EKG   Radiology DG Knee Complete 4 Views Left  Result Date: 08/05/2019 CLINICAL DATA:  LEFT knee pain for couple months progressively worsening over last few days, no known injury EXAM: LEFT KNEE - COMPLETE 4+ VIEW COMPARISON:  05/31/2018 FINDINGS: Osseous mineralization normal. Joint spaces preserved. No acute fracture, dislocation, or bone destruction. Moderate-sized joint effusion. IMPRESSION: Joint effusion without acute osseous abnormalities. Electronically Signed   By: Lavonia Dana M.D.   On: 08/05/2019 09:49    Procedures Procedures (including critical care time)  Medications Ordered in UC Medications - No data to display  Initial Impression / Assessment and Plan / UC Course  I have reviewed the triage vital signs and the nursing notes.  Pertinent labs & imaging results that were available during my care of the patient were reviewed by me and considered in my medical decision making (see chart for details).     Knee pain and effusion-x-ray negative for any acute fractures. Most likely meniscus or arthritis related. Will start on prednisone taper and have him rest, ice, elevate. Recommend purchase a copper knee sleeve or use Ace wrap Follow up with orthopedics.  Follow up as needed for continued or worsening symptoms  Final Clinical Impressions(s) / UC Diagnoses   Final diagnoses:  Knee effusion, left     Discharge Instructions     There was some fluid on x ray.  This is most likely from some sort of meniscus problem or could be arthritis related.  I recommend follow up with orthopedics.  I am prescribing prednisone taper to tale over the next 6 days.  Knee sleeve for support or ace wrap You need to rest, ice and elevate the knee.      ED Prescriptions    Medication Sig Dispense Auth. Provider   predniSONE (STERAPRED UNI-PAK 21 TAB) 10 MG (21) TBPK tablet 6 tabs for 1 day, then 5 tabs for 1  das, then 4 tabs for 1 day, then 3 tabs for 1 day, 2 tabs for 1 day,  then 1 tab for 1 day 21 tablet Lillard Bailon A, NP     PDMP not reviewed this encounter.   Dahlia Byes A, NP 08/05/19 1012

## 2019-08-05 NOTE — ED Notes (Signed)
Patient denies knee sleeve.  States he has one at home.  Dahlia Byes NP is aware.

## 2019-08-05 NOTE — Discharge Instructions (Addendum)
There was some fluid on x ray.  This is most likely from some sort of meniscus problem or could be arthritis related.  I recommend follow up with orthopedics.  I am prescribing prednisone taper to tale over the next 6 days.  Knee sleeve for support or ace wrap You need to rest, ice and elevate the knee.

## 2019-08-21 ENCOUNTER — Other Ambulatory Visit: Payer: Self-pay

## 2019-08-21 ENCOUNTER — Encounter (HOSPITAL_COMMUNITY): Payer: Self-pay

## 2019-08-21 ENCOUNTER — Ambulatory Visit (HOSPITAL_COMMUNITY)
Admission: EM | Admit: 2019-08-21 | Discharge: 2019-08-21 | Disposition: A | Discharge status: Home / Self Care | Payer: 59 | Attending: Family Medicine | Admitting: Family Medicine

## 2019-08-21 ENCOUNTER — Ambulatory Visit (INDEPENDENT_AMBULATORY_CARE_PROVIDER_SITE_OTHER): Payer: 59

## 2019-08-21 DIAGNOSIS — R0602 Shortness of breath: Secondary | ICD-10-CM | POA: Diagnosis not present

## 2019-08-21 DIAGNOSIS — R030 Elevated blood-pressure reading, without diagnosis of hypertension: Secondary | ICD-10-CM

## 2019-08-21 DIAGNOSIS — R079 Chest pain, unspecified: Secondary | ICD-10-CM

## 2019-08-21 DIAGNOSIS — R000 Tachycardia, unspecified: Secondary | ICD-10-CM

## 2019-08-21 NOTE — ED Provider Notes (Signed)
[facility]   [id number] 08/21/19 Arrival Time: 1024  ASSESSMENT & PLAN:  1. Chest pain, unspecified type   2. SOB (shortness of breath)   3. Elevated blood pressure reading without diagnosis of hypertension   4. Tachycardia     ECG: Performed today and interpreted by me: sinus tachycardia.  I have personally viewed the imaging studies ordered this visit. No acute findings. No pneumothorax.  Discussed that I cannot r/o PE as a cause of his current descriptions of abrupt onset of CP with SOB; worse when supine; smoker; with tachycardia. Recommend ED evaluation. He agrees and voices understanding. D/C to ED by private vehicle. Stable upon discharge.  Reviewed expectations re: course of current medical issues. Questions answered. Outlined signs and symptoms indicating need for more acute intervention. Patient verbalized understanding. After Visit Summary given.   SUBJECTIVE:  History from: patient. Jonathon Mendez is a 34 y.o. male who presents with complaint of intermittent upper chest pain described as a heaviness "like someone sitting on my chest". First noticed approx 48 hours ago. Now 2/10; at worst 8/10 last evening; woke him from sleep. Also describes "feeling like my left arm is a little numb sometimes". Also over a couple of days. No injuries. Smokes cigarettes. Occasional alcohol use. Denies street drug use. No LE edema or calf pain. No recent prolonged travel. No new medications. Afebrile. No recent illnesses. Does not feels symptoms worsen with exertion. No h/o similar.   Social History   Tobacco Use  Smoking Status Current Every Day Smoker  . Packs/day: 0.50  . Years: 5.00  . Pack years: 2.50  . Types: Cigarettes  Smokeless Tobacco Never Used   Social History   Substance and Sexual Activity  Alcohol Use Yes  . Alcohol/week: 3.0 standard drinks  . Types: 3 Cans of beer per week   Comment: 24oz can     OBJECTIVE:  Vitals:   08/21/19  1115 08/21/19 1116  BP: (!) 176/88   Pulse: (!) 116   Resp: 18   Temp: 98.3 F (36.8 C)   TempSrc: Oral   SpO2: 98%   Weight:  81.6 kg  Height:  5\' 9"  (1.753 m)    Elevated BP and tachycardia noted.  General appearance: alert, oriented, no acute distress Eyes: PERRLA; EOMI; conjunctivae normal HENT: normocephalic; atraumatic Neck: supple with FROM Lungs: without labored respirations; speaks full sentences without difficulty; CTAB Heart: tachycardic; regular Chest Wall: without specific tenderness to palpation Abdomen: soft, non-tender; no guarding or rebound tenderness Extremities: without edema; without calf swelling or tenderness; symmetrical without gross deformities Skin: warm and dry; without rash or lesions Neuro: normal gait Psychological: alert and cooperative; normal mood and affect   Imaging: DG Chest 2 View  Result Date: 08/21/2019 CLINICAL DATA:  Chest pain short of breath EXAM: CHEST - 2 VIEW COMPARISON:  None. FINDINGS: The heart size and mediastinal contours are within normal limits. Both lungs are clear. The visualized skeletal structures are unremarkable. IMPRESSION: No active cardiopulmonary disease. Electronically Signed   By: 10/21/2019 M.D.   On: 08/21/2019 11:48     No Known Allergies  Past Medical History:  Diagnosis Date  . Nasal fracture 08/22/2017  . Zygomatic fracture (HCC) 08/22/2017   left   Social History   Socioeconomic History  . Marital status: Significant Other    Spouse name: Not on file  . Number of children: Not on file  . Years of education: Not on file  . Highest education  level: Not on file  Occupational History  . Not on file  Tobacco Use  . Smoking status: Current Every Day Smoker    Packs/day: 0.50    Years: 5.00    Pack years: 2.50    Types: Cigarettes  . Smokeless tobacco: Never Used  Substance and Sexual Activity  . Alcohol use: Yes    Alcohol/week: 3.0 standard drinks    Types: 3 Cans of beer per week     Comment: 24oz can  . Drug use: No  . Sexual activity: Not on file  Other Topics Concern  . Not on file  Social History Narrative  . Not on file   Social Determinants of Health   Financial Resource Strain:   . Difficulty of Paying Living Expenses:   Food Insecurity:   . Worried About Charity fundraiser in the Last Year:   . Arboriculturist in the Last Year:   Transportation Needs:   . Film/video editor (Medical):   [name] Lack of Transportation (Non-Medical):   Physical Activity:   . Days of Exercise per Week:   . Minutes of Exercise per Session:   Stress:   . Feeling of Stress :   Social Connections:   . Frequency of Communication with Friends and Family:   . Frequency of Social Gatherings with Friends and Family:   . Attends Religious Services:   . Active Member of Clubs or Organizations:   . Attends Archivist Meetings:   [name] Marital Status:   Intimate Partner Violence:   . Fear of Current or Ex-Partner:   . Emotionally Abused:   [name] Physically Abused:   . Sexually Abused:    No family history on file. Past Surgical History:  Procedure Laterality Date  . TENDON REPAIR Left    arm  . WISDOM TOOTH EXTRACTION       Vanessa Kick, MD 08/21/19 [phone]

## 2019-08-21 NOTE — ED Triage Notes (Signed)
Pt c/o 6/10 chest pain on right and left side that feels heavy, pt had a cough Tuesday, but it stopped Wednesday, but pt still has the chest pain. Pt also states left arm feels numb. Pt c/o SOB w/chest pain and nausea. Pt has non labored breathing. Skin color WNL.

## 2020-03-04 ENCOUNTER — Other Ambulatory Visit: Payer: Self-pay

## 2020-03-04 ENCOUNTER — Other Ambulatory Visit: Payer: 59

## 2020-03-04 DIAGNOSIS — Z20822 Contact with and (suspected) exposure to covid-19: Secondary | ICD-10-CM

## 2020-03-05 LAB — SARS-COV-2, NAA 2 DAY TAT

## 2020-03-05 LAB — NOVEL CORONAVIRUS, NAA: SARS-CoV-2, NAA: NOT DETECTED

## 2020-04-12 ENCOUNTER — Other Ambulatory Visit: Payer: Self-pay

## 2020-04-12 DIAGNOSIS — J069 Acute upper respiratory infection, unspecified: Secondary | ICD-10-CM | POA: Insufficient documentation

## 2020-04-12 DIAGNOSIS — F17210 Nicotine dependence, cigarettes, uncomplicated: Secondary | ICD-10-CM | POA: Insufficient documentation

## 2020-04-12 DIAGNOSIS — Z20822 Contact with and (suspected) exposure to covid-19: Secondary | ICD-10-CM | POA: Insufficient documentation

## 2020-04-13 ENCOUNTER — Encounter (HOSPITAL_BASED_OUTPATIENT_CLINIC_OR_DEPARTMENT_OTHER): Payer: Self-pay | Admitting: Emergency Medicine

## 2020-04-13 ENCOUNTER — Emergency Department (HOSPITAL_BASED_OUTPATIENT_CLINIC_OR_DEPARTMENT_OTHER): Payer: Self-pay

## 2020-04-13 ENCOUNTER — Emergency Department (HOSPITAL_BASED_OUTPATIENT_CLINIC_OR_DEPARTMENT_OTHER)
Admission: EM | Admit: 2020-04-13 | Discharge: 2020-04-13 | Disposition: A | Discharge status: Home / Self Care | Payer: Self-pay | Attending: Emergency Medicine | Admitting: Emergency Medicine

## 2020-04-13 DIAGNOSIS — J069 Acute upper respiratory infection, unspecified: Secondary | ICD-10-CM

## 2020-04-13 LAB — RESP PANEL BY RT-PCR (FLU A&B, COVID) ARPGX2
Influenza A by PCR: NEGATIVE
Influenza B by PCR: NEGATIVE
SARS Coronavirus 2 by RT PCR: NEGATIVE

## 2020-04-13 MED ORDER — OXYMETAZOLINE HCL 0.05 % NA SOLN
1.0000 | Freq: Once | NASAL | Status: AC
  Administered 2020-04-13: 1 via NASAL
  Filled 2020-04-13: qty 30

## 2020-04-13 MED ORDER — ALBUTEROL SULFATE HFA 108 (90 BASE) MCG/ACT IN AERS
1.0000 | INHALATION_SPRAY | Freq: Once | RESPIRATORY_TRACT | Status: AC
  Administered 2020-04-13: 2 via RESPIRATORY_TRACT
  Filled 2020-04-13: qty 6.7

## 2020-04-13 NOTE — ED Notes (Signed)
Discharge instructions discussed with patient. Verbalized understanding. Departs ED at this time in stable condition with improvement based on the medications he received. Return instructions and follow up care discussed.

## 2020-04-13 NOTE — ED Provider Notes (Signed)
[facility] EMERGENCY DEPARTMENT Provider Note   CSN: [id number] Arrival date & time: 04/12/20  2354     History Chief Complaint  Patient presents with  . Cough    Jonathon Mendez is a 34 y.o. male.  The history is provided by the patient.  Cough Severity:  Moderate Onset quality:  Gradual Duration:  1 day Timing:  Intermittent Progression:  Worsening Chronicity:  New Smoker: yes   Relieved by:  Nothing Worsened by:  Nothing Associated symptoms: chest pain, chills, myalgias, shortness of breath and sinus congestion   Patient's had cough and congestion.  He was at work and was instructed to come to the ER.     Past Medical History:  Diagnosis Date  . Nasal fracture 08/22/2017  . Zygomatic fracture (HCC) 08/22/2017   left    There are no problems to display for this patient.   Past Surgical History:  Procedure Laterality Date  . TENDON REPAIR Left    arm  . WISDOM TOOTH EXTRACTION         No family history on file.  Social History   Tobacco Use  . Smoking status: Current Every Day Smoker    Packs/day: 0.50    Years: 5.00    Pack years: 2.50    Types: Cigarettes  . Smokeless tobacco: Never Used  Vaping Use  . Vaping Use: Never used  Substance Use Topics  . Alcohol use: Yes    Alcohol/week: 3.0 standard drinks    Types: 3 Cans of beer per week    Comment: 24oz can  . Drug use: No    Home Medications Prior to Admission medications   Medication Sig Start Date End Date Taking? Authorizing Provider  ibuprofen (ADVIL,MOTRIN) 600 MG tablet Take 1 tablet (600 mg total) by mouth every 8 (eight) hours as needed. 05/31/18   Linwood Dibbles, MD    Allergies    Patient has no known allergies.  Review of Systems   Review of Systems  Constitutional: Positive for chills.  Respiratory: Positive for cough and shortness of breath.   Cardiovascular: Positive for chest pain.  Gastrointestinal: Negative for vomiting.  Musculoskeletal:  Positive for myalgias.  All other systems reviewed and are negative.   Physical Exam Updated Vital Signs BP (!) 156/95 (BP Location: Right Arm)   Pulse 100   Temp 98.5 F (36.9 C) (Oral)   Resp (!) 22   Wt 82 kg   SpO2 100%   BMI 26.70 kg/m   Physical Exam CONSTITUTIONAL: Well developed/well nourished HEAD: Normocephalic/atraumatic EYES: EOMI/PERRL ENMT: Mucous membranes moist, uvula midline without erythema or exudate NECK: supple no meningeal signs SPINE/BACK:entire spine nontender CV: S1/S2 noted, no murmurs/rubs/gallops noted LUNGS: Diminished breath sounds bilaterally, no acute distress, patient putting forth minimal effort ABDOMEN: soft NEURO: Pt is awake/alert/appropriate, moves all extremitiesx4.  No facial droop.   EXTREMITIES:  full ROM SKIN: warm, color normal PSYCH: no abnormalities of mood noted, alert and oriented to situation  ED Results / Procedures / Treatments   Labs (all labs ordered are listed, but only abnormal results are displayed) Labs Reviewed  RESP PANEL BY RT-PCR (FLU A&B, COVID) ARPGX2    EKG None  Radiology DG Chest 2 View  Result Date: 04/13/2020 CLINICAL DATA:  Cough and nasal congestion today. Negative COVID test. EXAM: CHEST - 2 VIEW COMPARISON:  08/21/2019 FINDINGS: The heart size and mediastinal contours are within normal limits. Both lungs are clear. The visualized skeletal structures are unremarkable. IMPRESSION:  No active cardiopulmonary disease. Electronically Signed   By: Burman Nieves M.D.   On: 04/13/2020 01:41    Procedures Procedures   Medications Ordered in ED Medications  albuterol (VENTOLIN HFA) 108 (90 Base) MCG/ACT inhaler 1-2 puff (2 puffs Inhalation Given 04/13/20 0128)  oxymetazoline (AFRIN) 0.05 % nasal spray 1 spray (1 spray Each Nare Given 04/13/20 0151)    ED Course  I have reviewed the triage vital signs and the nursing notes.  Pertinent labs & imaging results that were available during my care of  the patient were reviewed by me and considered in my medical decision making (see chart for details).    MDM Rules/Calculators/A&P                            Patient with viral URI.  Covid negative.  Chest x-ray is reviewed and is negative.  No hypoxia.  Advised patient to quit smoking  Jonathon Mendez was evaluated in Emergency Department on 04/13/2020 for the symptoms described in the history of present illness. He was evaluated in the context of the global COVID-19 pandemic, which necessitated consideration that the patient might be at risk for infection with the SARS-CoV-2 virus that causes COVID-19. Institutional protocols and algorithms that pertain to the evaluation of patients at risk for COVID-19 are in a state of rapid change based on information released by regulatory bodies including the CDC and federal and state organizations. These policies and algorithms were followed during the patient's care in the ED.  Final Clinical Impression(s) / ED Diagnoses Final diagnoses:  Viral URI with cough    Rx / DC Orders ED Discharge Orders    None       Zadie Rhine, MD 04/13/20 [phone]

## 2020-04-13 NOTE — ED Triage Notes (Signed)
Cough and nasal congestion times 1 day. No fever, no sob.

## 2020-04-13 NOTE — ED Notes (Signed)
Having coughs, some shortness of breath with shoulder aching bilaterally. Coughing began late last PM, been having stuffy nose, dry. 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 Denies having fevers.

## 2020-05-14 ENCOUNTER — Other Ambulatory Visit: Payer: Self-pay

## 2020-05-14 ENCOUNTER — Encounter (HOSPITAL_BASED_OUTPATIENT_CLINIC_OR_DEPARTMENT_OTHER): Payer: Self-pay

## 2020-05-14 ENCOUNTER — Emergency Department (HOSPITAL_BASED_OUTPATIENT_CLINIC_OR_DEPARTMENT_OTHER)
Admission: EM | Admit: 2020-05-14 | Discharge: 2020-05-14 | Disposition: A | Discharge status: Home / Self Care | Payer: HRSA Program | Attending: Emergency Medicine | Admitting: Emergency Medicine

## 2020-05-14 DIAGNOSIS — F17210 Nicotine dependence, cigarettes, uncomplicated: Secondary | ICD-10-CM | POA: Diagnosis not present

## 2020-05-14 DIAGNOSIS — U071 COVID-19: Secondary | ICD-10-CM | POA: Diagnosis not present

## 2020-05-14 DIAGNOSIS — M791 Myalgia, unspecified site: Secondary | ICD-10-CM | POA: Diagnosis present

## 2020-05-14 DIAGNOSIS — Z20822 Contact with and (suspected) exposure to covid-19: Secondary | ICD-10-CM

## 2020-05-14 LAB — RESP PANEL BY RT-PCR (FLU A&B, COVID) ARPGX2
Influenza A by PCR: NEGATIVE
Influenza B by PCR: NEGATIVE
SARS Coronavirus 2 by RT PCR: POSITIVE — AB

## 2020-05-14 NOTE — ED Triage Notes (Signed)
Pt arrives ambulatory to ED with reports of positive home Covid test today. C/o body aches X3 days reports taking 2 tylenol PTA 99.0 temp in triage.

## 2020-05-14 NOTE — ED Notes (Signed)
Discharge instructions discussed. Departs ED at this time with strict follow up care.

## 2020-05-14 NOTE — ED Provider Notes (Signed)
[facility] EMERGENCY DEPARTMENT Provider Note   CSN: [id number] Arrival date & time: 05/14/20  1903     History Chief Complaint  Patient presents with  . Covid Positive    Jonathon Mendez is a 34 y.o. male.  HPI 34 year old male presents with a positive home Covid test.  Started feeling bad over the last couple days with body aches, hot and cold sensation, mild cough and some nausea without vomiting.  Received 1 dose COVID vaccine but not the other.  No dyspnea.   Past Medical History:  Diagnosis Date  . Nasal fracture 08/22/2017  . Zygomatic fracture (HCC) 08/22/2017   left    There are no problems to display for this patient.   Past Surgical History:  Procedure Laterality Date  . TENDON REPAIR Left    arm  . WISDOM TOOTH EXTRACTION         No family history on file.  Social History   Tobacco Use  . Smoking status: Current Every Day Smoker    Packs/day: 1.00    Years: 5.00    Pack years: 5.00    Types: Cigarettes  . Smokeless tobacco: Never Used  Vaping Use  . Vaping Use: Never used  Substance Use Topics  . Alcohol use: Yes    Alcohol/week: 3.0 standard drinks    Types: 3 Cans of beer per week    Comment: 24oz can  . Drug use: No    Home Medications Prior to Admission medications   Medication Sig Start Date End Date Taking? Authorizing Provider  ibuprofen (ADVIL,MOTRIN) 600 MG tablet Take 1 tablet (600 mg total) by mouth every 8 (eight) hours as needed. 05/31/18   Linwood Dibbles, MD    Allergies    Patient has no known allergies.  Review of Systems   Review of Systems  Constitutional: Positive for fever.  Respiratory: Positive for cough. Negative for shortness of breath.   Gastrointestinal: Negative for vomiting.  Musculoskeletal: Positive for myalgias.    Physical Exam Updated Vital Signs BP 130/89   Pulse 98   Temp 99 F (37.2 C) (Oral)   Resp 18   Ht 5\' 9"  (1.753 m)   Wt 98.9 kg   SpO2 98%   BMI 32.19 kg/m    Physical Exam Vitals and nursing note reviewed.  Constitutional:      Appearance: He is well-developed and well-nourished.  HENT:     Head: Normocephalic and atraumatic.     Right Ear: External ear normal.     Left Ear: External ear normal.     Nose: Nose normal.  Eyes:     General:        Right eye: No discharge.        Left eye: No discharge.  Pulmonary:     Effort: Pulmonary effort is normal.  Abdominal:     General: There is no distension.  Musculoskeletal:        General: No edema.     Cervical back: Neck supple.  Skin:    General: Skin is warm and dry.  Neurological:     Mental Status: He is alert.  Psychiatric:        Mood and Affect: Mood is not anxious.     ED Results / Procedures / Treatments   Labs (all labs ordered are listed, but only abnormal results are displayed) Labs Reviewed  RESP PANEL BY RT-PCR (FLU A&B, COVID) ARPGX2    EKG None  Radiology No results  found.  Procedures Procedures (including critical care time)  Medications Ordered in ED Medications - No data to display  ED Course  I have reviewed the triage vital signs and the nursing notes.  Pertinent labs & imaging results that were available during my care of the patient were reviewed by me and considered in my medical decision making (see chart for details).    MDM Rules/Calculators/A&P                          Patient with suspected Covid-19.  Vital signs are unremarkable including normal oxygenation and no increased work of breathing.  Highly doubt pneumonia or other serious illness.  Will test for COVID-19.  Discharged with return precautions.  Jonathon Mendez was evaluated in Emergency Department on 05/14/2020 for the symptoms described in the history of present illness. He was evaluated in the context of the global COVID-19 pandemic, which necessitated consideration that the patient might be at risk for infection with the SARS-CoV-2 virus that causes COVID-19.  Institutional protocols and algorithms that pertain to the evaluation of patients at risk for COVID-19 are in a state of rapid change based on information released by regulatory bodies including the CDC and federal and state organizations. These policies and algorithms were followed during the patient's care in the ED.  Final Clinical Impression(s) / ED Diagnoses Final diagnoses:  Suspected COVID-19 virus infection    Rx / DC Orders ED Discharge Orders    None       Pricilla Loveless, MD 05/14/20 2044

## 2020-05-14 NOTE — ED Notes (Signed)
Pt's PCR COVID test positive - notified pt via phone

## 2020-05-17 ENCOUNTER — Other Ambulatory Visit: Payer: Self-pay | Admitting: Nurse Practitioner

## 2020-05-17 DIAGNOSIS — U071 COVID-19: Secondary | ICD-10-CM

## 2020-05-17 NOTE — Progress Notes (Signed)
I connected by phone with Jonathon Mendez on 05/17/2020 at 2:04 PM to discuss the potential use of a new treatment for mild to moderate COVID-19 viral infection in non-hospitalized patients.  This patient is a 34 y.o. male that meets the FDA criteria for Emergency Use Authorization of COVID monoclonal antibody casirivimab/imdevimab, bamlanivimab/etesevimab, or sotrovimab.  Has a (+) direct SARS-CoV-2 viral test result  Has mild or moderate COVID-19   Is NOT hospitalized due to COVID-19  Is within 10 days of symptom onset  Has at least one of the high risk factor(s) for progression to severe COVID-19 and/or hospitalization as defined in EUA.  Specific high risk criteria : BMI > 25, Cardiovascular disease or hypertension and Other high risk medical condition per CDC:  smoker   I have spoken and communicated the following to the patient or parent/caregiver regarding COVID monoclonal antibody treatment:  1. FDA has authorized the emergency use for the treatment of mild to moderate COVID-19 in adults and pediatric patients with positive results of direct SARS-CoV-2 viral testing who are 45 years of age and older weighing at least 40 kg, and who are at high risk for progressing to severe COVID-19 and/or hospitalization.  2. The significant known and potential risks and benefits of COVID monoclonal antibody, and the extent to which such potential risks and benefits are unknown.  3. Information on available alternative treatments and the risks and benefits of those alternatives, including clinical trials.  4. Patients treated with COVID monoclonal antibody should continue to self-isolate and use infection control measures (e.g., wear mask, isolate, social distance, avoid sharing personal items, clean and disinfect "high touch" surfaces, and frequent handwashing) according to CDC guidelines.   5. The patient or parent/caregiver has the option to accept or refuse COVID monoclonal antibody  treatment.  After reviewing this information with the patient, the patient has agreed to receive one of the available covid 19 monoclonal antibodies and will be provided an appropriate fact sheet prior to infusion. Mayra Reel, NP 05/17/2020 2:04 PM

## 2020-05-18 ENCOUNTER — Ambulatory Visit (HOSPITAL_COMMUNITY)
Admission: RE | Admit: 2020-05-18 | Discharge: 2020-05-18 | Disposition: A | Discharge status: Home / Self Care | Payer: HRSA Program | Source: Ambulatory Visit | Attending: Pulmonary Disease | Admitting: Pulmonary Disease

## 2020-05-18 DIAGNOSIS — U071 COVID-19: Secondary | ICD-10-CM | POA: Diagnosis present

## 2020-05-18 MED ORDER — SODIUM CHLORIDE 0.9 % IV SOLN: INTRAVENOUS | Status: DC | PRN

## 2020-05-18 MED ORDER — FAMOTIDINE IN NACL 20-0.9 MG/50ML-% IV SOLN: 20.0000 mg | Freq: Once | INTRAVENOUS | Status: DC | PRN

## 2020-05-18 MED ORDER — ALBUTEROL SULFATE HFA 108 (90 BASE) MCG/ACT IN AERS: 2.0000 | INHALATION_SPRAY | Freq: Once | RESPIRATORY_TRACT | Status: DC | PRN

## 2020-05-18 MED ORDER — EPINEPHRINE 0.3 MG/0.3ML IJ SOAJ: 0.3000 mg | Freq: Once | INTRAMUSCULAR | Status: DC | PRN

## 2020-05-18 MED ORDER — METHYLPREDNISOLONE SODIUM SUCC 125 MG IJ SOLR: 125.0000 mg | Freq: Once | INTRAMUSCULAR | Status: DC | PRN

## 2020-05-18 MED ORDER — SODIUM CHLORIDE 0.9 % IV SOLN: Freq: Once | INTRAVENOUS | Status: AC

## 2020-05-18 MED ORDER — DIPHENHYDRAMINE HCL 50 MG/ML IJ SOLN: 50.0000 mg | Freq: Once | INTRAMUSCULAR | Status: DC | PRN

## 2020-05-18 NOTE — Progress Notes (Signed)
°  Diagnosis: COVID-19  Physician:  Dr. Shan Levans  Procedure: Cov[facility] Infusion Clinic Med: casirivimab\imdevimab infusion - Provided patient with casirivimab\imdevimab fact sheet for patients, parents and caregivers prior to infusion.  Complications: No immediate complications noted.  Discharge: Discharged home   Jonathon Mendez 05/18/2020 ,

## 2020-05-18 NOTE — Discharge Instructions (Signed)
10 Things You Can Do to Manage Your COVID-19 Symptoms at Home If you have possible or confirmed COVID-19: 1. Stay home from work and school. And stay away from other public places. If you must go out, avoid using any kind of public transportation, ridesharing, or taxis. 2. Monitor your symptoms carefully. If your symptoms get worse, call your healthcare provider immediately. 3. Get rest and stay hydrated. 4. If you have a medical appointment, call the healthcare provider ahead of time and tell them that you have or may have COVID-19. 5. For medical emergencies, call 911 and notify the dispatch personnel that you have or may have COVID-19. 6. Cover your cough and sneezes with a tissue or use the inside of your elbow. 7. Wash your hands often with soap and water for at least 20 seconds or clean your hands with an alcohol-based hand sanitizer that contains at least 60% alcohol. 8. As much as possible, stay in a specific room and away from other people in your home. Also, you should use a separate bathroom, if available. If you need to be around other people in or outside of the home, wear a mask. 9. Avoid sharing personal items with other people in your household, like dishes, towels, and bedding. 10. Clean all surfaces that are touched often, like counters, tabletops, and doorknobs. Use household cleaning sprays or wipes according to the label instructions. cdc.gov/coronavirus 11/20/2018 This information is not intended to replace advice given to you by your health care provider. Make sure you discuss any questions you have with your health care provider. Document Revised: 04/24/2019 Document Reviewed: 04/24/2019 Elsevier Patient Education  2020 Elsevier Inc. 10 Things You Can Do to Manage Your COVID-19 Symptoms at Home If you have possible or confirmed COVID-19: 11. Stay home from work and school. And stay away from other public places. If you must go out, avoid using any kind of public  transportation, ridesharing, or taxis. 12. Monitor your symptoms carefully. If your symptoms get worse, call your healthcare provider immediately. 13. Get rest and stay hydrated. 14. If you have a medical appointment, call the healthcare provider ahead of time and tell them that you have or may have COVID-19. 15. For medical emergencies, call 911 and notify the dispatch personnel that you have or may have COVID-19. 16. Cover your cough and sneezes with a tissue or use the inside of your elbow. 17. Wash your hands often with soap and water for at least 20 seconds or clean your hands with an alcohol-based hand sanitizer that contains at least 60% alcohol. 18. As much as possible, stay in a specific room and away from other people in your home. Also, you should use a separate bathroom, if available. If you need to be around other people in or outside of the home, wear a mask. 19. Avoid sharing personal items with other people in your household, like dishes, towels, and bedding. 20. Clean all surfaces that are touched often, like counters, tabletops, and doorknobs. Use household cleaning sprays or wipes according to the label instructions. cdc.gov/coronavirus 11/20/2018 This information is not intended to replace advice given to you by your health care provider. Make sure you discuss any questions you have with your health care provider. Document Revised: 04/24/2019 Document Reviewed: 04/24/2019 Elsevier Patient Education  2020 Elsevier Inc.   What types of side effects do monoclonal antibody drugs cause?  Common side effects  In general, the more common side effects caused by monoclonal antibody drugs include: .   Allergic reactions, such as hives or itching . Flu-like signs and symptoms, including chills, fatigue, fever, and muscle aches and pains . Nausea, vomiting . Diarrhea . Skin rashes . Low blood pressure   The CDC is recommending patients who receive monoclonal antibody treatments  wait at least 90 days before being vaccinated.  Currently, there are no data on the safety and efficacy of mRNA COVID-19 vaccines in persons who received monoclonal antibodies or convalescent plasma as part of COVID-19 treatment. Based on the estimated half-life of such therapies as well as evidence suggesting that reinfection is uncommon in the 90 days after initial infection, vaccination should be deferred for at least 90 days, as a precautionary measure until additional information becomes available, to avoid interference of the antibody treatment with vaccine-induced immune responses.   If you have any questions or concerns after the infusion please call the Advanced Practice Provider on call at [phone]. This number is ONLY intended for your use regarding questions or concerns about the infusion post-treatment side-effects.  Please do not provide this number to others for use. For return to work notes please contact your primary care provider.   If someone you know is interested in receiving treatment please have them call the COVID hotline at [phone].    

## 2020-05-18 NOTE — Progress Notes (Signed)
Patient reviewed Fact Sheet for Patients, Parents, and Caregivers for Emergency Use Authorization (EUA) of bamlanivimab and etesevimab for the Treatment of Coronavirus. Patient also reviewed and is agreeable to the estimated cost of treatment. Patient is agreeable to proceed.   

## 2020-08-09 ENCOUNTER — Other Ambulatory Visit: Payer: Self-pay

## 2020-08-09 ENCOUNTER — Encounter (HOSPITAL_COMMUNITY): Payer: Self-pay

## 2020-08-09 ENCOUNTER — Ambulatory Visit (HOSPITAL_COMMUNITY)
Admission: EM | Admit: 2020-08-09 | Discharge: 2020-08-09 | Disposition: A | Discharge status: Home / Self Care | Payer: Managed Care, Other (non HMO)

## 2020-08-09 DIAGNOSIS — Z7689 Persons encountering health services in other specified circumstances: Secondary | ICD-10-CM | POA: Diagnosis not present

## 2020-08-09 NOTE — ED Triage Notes (Signed)
Pt c/o right knee pain X 1 week. Pt states it hurts to flex his knee a little. Pt denies severe pain.

## 2020-08-09 NOTE — ED Provider Notes (Signed)
[facility]  ____________________________________________  Time seen: Approximately 10:45 AM  I have reviewed the triage vital signs and the nursing notes.   HISTORY  Chief Complaint Knee Pain (right)   Historian Patient   HPI Jonathon Mendez is a 35 y.o. male presents to the urgent care for a return to work evaluation.  Patient states that he fell and injured his right knee approximately 2 weeks ago.  He states knee pain is in his note requires a return to work note.  Patient denies current pain and states that he is not need any medications.   Past Medical History:  Diagnosis Date  . Nasal fracture 08/22/2017  . Zygomatic fracture (HCC) 08/22/2017   left     Immunizations up to date:  Yes.     Past Medical History:  Diagnosis Date  . Nasal fracture 08/22/2017  . Zygomatic fracture (HCC) 08/22/2017   left    There are no problems to display for this patient.   Past Surgical History:  Procedure Laterality Date  . TENDON REPAIR Left    arm  . WISDOM TOOTH EXTRACTION      Prior to Admission medications   Medication Sig Start Date End Date Taking? Authorizing Provider  ibuprofen (ADVIL,MOTRIN) 600 MG tablet Take 1 tablet (600 mg total) by mouth every 8 (eight) hours as needed. 05/31/18   Linwood Dibbles, MD    Allergies Patient has no known allergies.  History reviewed. No pertinent family history.  Social History Social History   Tobacco Use  . Smoking status: Current Every Day Smoker    Packs/day: 1.00    Years: 5.00    Pack years: 5.00    Types: Cigarettes  . Smokeless tobacco: Never Used  Vaping Use  . Vaping Use: Never used  Substance Use Topics  . Alcohol use: Yes    Alcohol/week: 3.0 standard drinks    Types: 3 Cans of beer per week    Comment: 24oz can  . Drug use: No     Review of Systems  Constitutional: No fever/chills Eyes:  No discharge ENT: No upper respiratory complaints. Respiratory: no cough. No SOB/ use  of accessory muscles to breath Gastrointestinal:   No nausea, no vomiting.  No diarrhea.  No constipation. Musculoskeletal: Patient had right knee pain.  Skin: Negative for rash, abrasions, lacerations, ecchymosis.    ____________________________________________   PHYSICAL EXAM:  VITAL SIGNS: ED Triage Vitals  Enc Vitals Group     BP 08/09/20 1012 133/84     Pulse Rate 08/09/20 1012 (!) 112     Resp 08/09/20 1012 19     Temp 08/09/20 1012 98 F (36.7 C)     Temp Source 08/09/20 1012 Oral     SpO2 08/09/20 1012 100 %     Weight --      Height --      Head Circumference --      Peak Flow --      Pain Score 08/09/20 1011 0     Pain Loc --      Pain Edu? --      Excl. in GC? --      Constitutional: Alert and oriented. Well appearing and in no acute distress. Eyes: Conjunctivae are normal. PERRL. EOMI. Head: Atraumatic. ENT: Cardiovascular: Normal rate, regular rhythm. Normal S1 and S2.  Good peripheral circulation. Respiratory: Normal respiratory effort without tachypnea or retractions. Lungs CTAB. Good air entry to the bases with no decreased or absent breath  sounds Gastrointestinal: Bowel sounds x 4 quadrants. Soft and nontender to palpation. No guarding or rigidity. No distention. Musculoskeletal: Full range of motion to all extremities. No obvious deformities noted Neurologic:  Normal for age. No gross focal neurologic deficits are appreciated.  Skin:  Skin is warm, dry and intact. No rash noted. Psychiatric: Mood and affect are normal for age. Speech and behavior are normal.   ____________________________________________   LABS (all labs ordered are listed, but only abnormal results are displayed)  Labs Reviewed - No data to display ____________________________________________  EKG   ____________________________________________  RADIOLOGY   No results found.  ____________________________________________    PROCEDURES  Procedure(s) performed:      Procedures     Medications - No data to display   ____________________________________________   INITIAL IMPRESSION / ASSESSMENT AND PLAN / ED COURSE  Pertinent labs & imaging results that were available during my care of the patient were reviewed by me and considered in my medical decision making (see chart for details).      Assessment and plan Return to work evaluation 35 year old male presents to the urgent care for a return to work note.  He denies current pain.  Work note was given.     ____________________________________________  FINAL CLINICAL IMPRESSION(S) / ED DIAGNOSES  Final diagnoses:  Return to work evaluation      NEW MEDICATIONS STARTED DURING THIS VISIT:  ED Discharge Orders    None          This chart was dictated using voice recognition software/Dragon. Despite best efforts to proofread, errors can occur which can change the meaning. Any change was purely unintentional.     Orvil Feil, PA-C 08/09/20 1047

## 2021-04-07 ENCOUNTER — Encounter (HOSPITAL_BASED_OUTPATIENT_CLINIC_OR_DEPARTMENT_OTHER): Payer: Self-pay

## 2021-04-07 ENCOUNTER — Emergency Department (HOSPITAL_BASED_OUTPATIENT_CLINIC_OR_DEPARTMENT_OTHER)
Admission: EM | Admit: 2021-04-07 | Discharge: 2021-04-07 | Disposition: A | Discharge status: Home / Self Care | Payer: Managed Care, Other (non HMO) | Attending: Emergency Medicine | Admitting: Emergency Medicine

## 2021-04-07 ENCOUNTER — Other Ambulatory Visit: Payer: Self-pay

## 2021-04-07 DIAGNOSIS — F17210 Nicotine dependence, cigarettes, uncomplicated: Secondary | ICD-10-CM | POA: Diagnosis not present

## 2021-04-07 DIAGNOSIS — R369 Urethral discharge, unspecified: Secondary | ICD-10-CM | POA: Insufficient documentation

## 2021-04-07 LAB — URINALYSIS, ROUTINE W REFLEX MICROSCOPIC
Bilirubin Urine: NEGATIVE
Glucose, UA: NEGATIVE mg/dL
Hgb urine dipstick: NEGATIVE
Ketones, ur: NEGATIVE mg/dL
Nitrite: NEGATIVE
Protein, ur: NEGATIVE mg/dL
Specific Gravity, Urine: 1.025 (ref 1.005–1.030)
pH: 6 (ref 5.0–8.0)

## 2021-04-07 LAB — URINALYSIS, MICROSCOPIC (REFLEX): WBC, UA: >50 WBC/hpf (ref 0–5)

## 2021-04-07 MED ORDER — LIDOCAINE HCL (PF) 1 % IJ SOLN
1.0000 mL | Freq: Once | INTRAMUSCULAR | Status: AC
  Administered 2021-04-07: 21:00:00 1 mL
  Filled 2021-04-07: qty 5

## 2021-04-07 MED ORDER — DOXYCYCLINE HYCLATE 100 MG PO TABS: 100.0000 mg | ORAL_TABLET | Freq: Two times a day (BID) | ORAL | 0 refills | Status: AC

## 2021-04-07 MED ORDER — CEFTRIAXONE SODIUM 500 MG IJ SOLR
500.0000 mg | Freq: Once | INTRAMUSCULAR | Status: AC
  Administered 2021-04-07: 21:00:00 500 mg via INTRAMUSCULAR
  Filled 2021-04-07: qty 500

## 2021-04-07 MED ORDER — DOXYCYCLINE HYCLATE 100 MG PO TABS
100.0000 mg | ORAL_TABLET | Freq: Once | ORAL | Status: AC
  Administered 2021-04-07: 21:00:00 100 mg via ORAL
  Filled 2021-04-07: qty 1

## 2021-04-07 NOTE — ED Provider Notes (Signed)
[facility] EMERGENCY DEPARTMENT Provider Note   CSN: [id number] Arrival date & time: 04/07/21  3299     History Chief Complaint  Patient presents with   Penile Discharge    Jonathon Mendez is a 35 y.o. male.  HPI Penile discharge for 4 days now.  States no testicular pain or penile pain apart from some discomfort with urinating.  He states that he has had a wider spray with his urination.  Denies any recent sexual encounters apart from with his monogamous partner.  Denies any abdominal pain chest pain fevers chills cough congestion.  States that his partner is not having any symptoms  He is monogamous with a male partner.     Past Medical History:  Diagnosis Date   Nasal fracture 08/22/2017   Zygomatic fracture (HCC) 08/22/2017   left    There are no problems to display for this patient.   Past Surgical History:  Procedure Laterality Date   TENDON REPAIR Left    arm   WISDOM TOOTH EXTRACTION         History reviewed. No pertinent family history.  Social History   Tobacco Use   Smoking status: Every Day    Packs/day: 1.00    Years: 5.00    Pack years: 5.00    Types: Cigarettes   Smokeless tobacco: Never  Vaping Use   Vaping Use: Never used  Substance Use Topics   Alcohol use: Yes    Alcohol/week: 3.0 standard drinks    Types: 3 Cans of beer per week    Comment: 24oz can   Drug use: No    Home Medications Prior to Admission medications   Medication Sig Start Date End Date Taking? Authorizing Provider  doxycycline (VIBRA-TABS) 100 MG tablet Take 1 tablet (100 mg total) by mouth 2 (two) times daily for 7 days. 04/07/21 04/14/21 Yes Denis Carreon S, PA  ibuprofen (ADVIL,MOTRIN) 600 MG tablet Take 1 tablet (600 mg total) by mouth every 8 (eight) hours as needed. 05/31/18   Linwood Dibbles, MD    Allergies    Patient has no known allergies.  Review of Systems   Review of Systems  Constitutional:  Negative for chills and fever.   HENT:  Negative for congestion.   Respiratory:  Negative for shortness of breath.   Cardiovascular:  Negative for chest pain.  Gastrointestinal:  Negative for abdominal pain.  Genitourinary:  Positive for penile discharge.  Musculoskeletal:  Negative for neck pain.   Physical Exam Updated Vital Signs BP (!) 125/107 (BP Location: Left Arm)   Pulse (!) 111   Temp 98.2 F (36.8 C) (Oral)   Resp 18   Ht 5\' 9"  (1.753 m)   Wt 104.3 kg   SpO2 100%   BMI 33.97 kg/m   Physical Exam Vitals and nursing note reviewed.  Constitutional:      General: He is not in acute distress.    Appearance: Normal appearance. He is not ill-appearing.  HENT:     Head: Normocephalic and atraumatic.  Eyes:     General: No scleral icterus.       Right eye: No discharge.        Left eye: No discharge.     Conjunctiva/sclera: Conjunctivae normal.  Pulmonary:     Effort: Pulmonary effort is normal.     Breath sounds: No stridor.  Abdominal:     Tenderness: There is no abdominal tenderness.  Genitourinary:    Comments: Penile discharge whitish present  at urethral meatus.  Testes normal lie no tenderness no swelling. Neurological:     Mental Status: He is alert and oriented to person, place, and time. Mental status is at baseline.    ED Results / Procedures / Treatments   Labs (all labs ordered are listed, but only abnormal results are displayed) Labs Reviewed  URINALYSIS, ROUTINE W REFLEX MICROSCOPIC - Abnormal; Notable for the following components:      Result Value   APPearance HAZY (*)    Leukocytes,Ua SMALL (*)    All other components within normal limits  URINALYSIS, MICROSCOPIC (REFLEX) - Abnormal; Notable for the following components:   Bacteria, UA RARE (*)    All other components within normal limits  GC/CHLAMYDIA PROBE AMP ([place]) NOT AT [facility]    EKG None  Radiology No results found.  Procedures Procedures   Medications Ordered in ED Medications  cefTRIAXone  (ROCEPHIN) injection 500 mg (has no administration in time range)  lidocaine (PF) (XYLOCAINE) 1 % injection 1 mL (has no administration in time range)  doxycycline (VIBRA-TABS) tablet 100 mg (has no administration in time range)    ED Course  I have reviewed the triage vital signs and the nursing notes.  Pertinent labs & imaging results that were available during my care of the patient were reviewed by me and considered in my medical decision making (see chart for details).    MDM Rules/Calculators/A&P                          Patient here for penile discharge  Some mild discomfort at the urethral meatus however no other pain.  Testicles with normal lie no swelling  There is some discharge of the urethral meatus  Urine NAAT collected and running at this time.  Urinalysis shows rare bacteria small leukocytes  We will go ahead and treat empirically for gonorrhea chlamydia. Given urology office information and case test comes back today for STD/symptoms continue.  Lengthy patient education discussion about sexual health and protection.  Final Clinical Impression(s) / ED Diagnoses Final diagnoses:  Penile discharge    Rx / DC Orders ED Discharge Orders          Ordered    doxycycline (VIBRA-TABS) 100 MG tablet  2 times daily        04/07/21 2047             Gailen Shelter, [place] 04/07/21 2053    Gwyneth Sprout, MD 04/07/21 2229

## 2021-04-07 NOTE — ED Triage Notes (Addendum)
Pt c/o penile discharge since Monday. C/o pain. States urine stream isnt straight, it "sprays everywhere". Denies testicle pain. Unsure of exposure to STD

## 2021-04-07 NOTE — Discharge Instructions (Signed)
You have been treated in the emergency department for an infection, possibly sexually transmitted. Results of your gonorrhea and chlamydia tests are pending and you will be notified if they are positive. Please refrain from intercourse for 7 days and until all sex partners (within previous 60 days) are evaluated and/or treated as well. Please follow up with your primary care provider for continued care and further STD evaluation.  It is very important to practice safe sex and use condoms when sexually active. If your results are positive you need to notify all sexual partners so they can be treated as well. The website [email or web address].dontspreadit.com/ can be used to send anonymous text messages or emails to alert sexual contacts. Follow up with your doctor, or OBGYN in regards to today's visit.   Gonorrhea and Chlamydia SYMPTOMS  In females, symptoms may go unnoticed. Symptoms that are more noticeable can include:  Belly (abdominal) pain.  Painful intercourse.  Watery mucous-like discharge from the vagina.  Miscarriage.  Discomfort when urinating.  Inflammation of the rectum.  Abnormal gray-green frothy vaginal discharge  Vaginal itching and irritatio  Itching and irritation of the area outside the vagina.   Painful urination.  Bleeding after sexual intercourse.  In males, symptoms include:  Burning with urination.  Pain in the testicles.  Watery mucous-like discharge from the penis.  It can cause longstanding (chronic) pelvic pain after frequent infections.  TREATMENT  PID can cause women to not be able to have children (sterile) if left untreated or if half-treated.  It is important to finish ALL medications given to you.  This is a sexually transmitted infection. So you are also at risk for other sexually transmitted diseases, including HIV (AIDS), it is recommended that you get tested. HOME CARE INSTRUCTIONS  Warning: This infection is contagious. Do not have sex until treatment is  completed. Follow up at your caregiver's office or the clinic to which you were referred. If your diagnosis (learning what is wrong) is confirmed by culture or some other method, your recent sexual contacts need treatment. Even if they are symptom free or have a negative culture or evaluation, they should be treated.  PREVENTION  Women should use sanitary pads instead of tampons for vaginal discharge.  Wipe front to back after using the toilet and avoid douching.   Practice safe sex, use condoms, have only one sex partner and be sure your sex partner is not having sex with others.  Ask your caregiver to test you for chlamydia at your regular checkups or sooner if you are having symptoms.  Ask for further information if you are pregnant.  SEEK IMMEDIATE MEDICAL CARE IF:  You develop an oral temperature above 102 F (38.9 C), not controlled by medications or lasting more than 2 days.  You develop an increase in pain.  You develop any type of abnormal discharge.  You develop vaginal bleeding and it is not time for your period.  You develop painful intercourse.   Bacterial Vaginosis  Bacterial vaginosis (BV) is a vaginal infection where the normal balance of bacteria in the vagina is disrupted. This is not a sexually transmitted disease and your sexual partners do NOT need to be treated. CAUSES  The cause of BV is not fully understood. BV develops when there is an increase or imbalance of harmful bacteria.  Some activities or behaviors can upset the normal balance of bacteria in the vagina and put women at increased risk including:  Having a new sex partner or multiple   sex partners.  Douching.  Using an intrauterine device (IUD) for contraception.  It is not clear what role sexual activity plays in the development of BV. However, women that have never had sexual intercourse are rarely infected with BV.  Women do not get BV from toilet seats, bedding, swimming pools or from touching objects around  them.   SYMPTOMS  Grey vaginal discharge.  A fish-like odor with discharge, especially after sexual intercourse.  Itching or burning of the vagina and vulva.  Burning or pain with urination.  Some women have no signs or symptoms at all.   TREATMENT  Sometimes BV will clear up without treatment.  BV may be treated with antibiotics.  BV can recur after treatment. If this happens, a second round of antibiotics will often be prescribed.  HOME CARE INSTRUCTIONS  Finish all medication as directed by your caregiver.  Do not have sex until treatment is completed.  Do NOT drink any alcoholic beverages while being treated  with Metronidazole (Flagyl). This will cause a severe reaction inducing vomiting.  RESOURCE GUIDE  Dental Problems  Patients with Medicaid: [facility]                     [facility] [address]                                           [address] Phone:  [phone]                                                  Phone:  [phone]  If unable to pay or uninsured, contact:  [facility] or [facility] to become qualified for the adult dental clinic.  Chronic Pain Problems Contact [facility]  [phone] Patients need to be referred by their primary care doctor.  Insufficient Money for Medicine Contact [employer]:  call "211" or [facility] 271-5999.  No Primary Care Doctor Call [facility]  832-8000 Other agencies that provide inexpensive medical care    [facility]  [phone]    [facility]  [phone]    [facility]  [phone]    [facility] Clinic  [phone]    [facility]  [phone]    [facility]  [phone]  Psychological Services [facility]  [phone] [facility]  [phone] [facility]   800 853-5163 (emergency services 641-4993)  Substance Abuse Resources [facility]   336-882-2125 [facility] 336-784-9470 [facility] 336-285-9073 [facility] 336-845-3988 Residential & Outpatient Substance Abuse Program  800-659-3381  Abuse/Neglect [place] Child Abuse Hotline (336) 641-3795 [place] Child Abuse Hotline 800-378-5315 (After Hours)  [facility]ter [facility] (336) 271-5985  [facility] (336) 275-9566 [facility] (704) 372-4663  MRSA Hotline #:   832-7006    [place] Resources  [facility] [place]     [employer]                          [facility] [address]   [place]                       [address]      [address]  [place]                                                [place]                            [place] Phone:  [phone]                                   Phone:  [phone]                 Phone:  [phone]  [facility] Phone:  [phone]  [place] Child Abuse Hotline (336) 342-1394 (336) 342-3537 (After Hours)   

## 2021-04-11 LAB — GC/CHLAMYDIA PROBE AMP (~~LOC~~) NOT AT ARMC
Chlamydia: NEGATIVE
Comment: NEGATIVE
Comment: NORMAL
Neisseria Gonorrhea: NEGATIVE

## 2021-05-20 ENCOUNTER — Ambulatory Visit (HOSPITAL_COMMUNITY)
Admission: RE | Admit: 2021-05-20 | Discharge: 2021-05-20 | Disposition: A | Discharge status: Home / Self Care | Payer: 59 | Attending: Psychiatry | Admitting: Psychiatry

## 2021-05-20 DIAGNOSIS — F1994 Other psychoactive substance use, unspecified with psychoactive substance-induced mood disorder: Secondary | ICD-10-CM | POA: Diagnosis present

## 2021-05-20 NOTE — H&P (Signed)
Behavioral Health Medical Screening Exam  Jonathon Mendez is an 35 y.o. malepatient presented to [facility] as a walk in accompanied by his fiance with complaints of "I would like information on intensive outpatient programs for meth use".  Jonathon Mendez, 35 y.o., male patient seen face to face by this provider, consulted with Dr. Gasper Sells; and chart reviewed on 05/20/21.  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  Reports he has used methamphetamine to cope with his depression and anxiety throughout the years.  He stopped using abruptly 2 weeks ago.  Reports he was using 20-30 gm of methamphetamines per week.  Denies any other substance use.  He currently does not take any medications.  He currently does not have an outpatient psychiatric provider in place.  Reports he saw a therapist a long time ago at [facility]	[place] and wellness.  During evaluation Jonathon Mendez is in sitting position in no acute distress.  He is well-groomed.  He makes fair eye contact.  His speech is clear, coherent, normal rate and tone.  He is anxious.  He taps his foot on the floor and switches his locker key from hand to hand.  He denies any withdrawal symptoms other than feeling anxious.He is alert/oriented x 4. He is cooperative. He endorses increased depression and anxiety after he stopped using mthamphetamines two weeks ago. He endorses feelings of guilt, worthlessness, irritability, tearfulness, isolation, and fatigue.  Denies any concerns with appetite.  Reports he sleeps anywhere from 4 to 8 hours per night.  States it is difficult for him to sleep because he is used to working third shift.  Reports he has taken off from work since 12/26 due to his inability to focus.  Objectively there is no evidence of psychosis/mania or delusional thinking.  He does not appear to be responding to internal/external stimuli.  He is forward thinking.  He is  goal-directed and discusses his willingness to participate in an intensive program.  He denies SI/HI/AVH.  Reports 1 suicide attempt in the past.  States he did not actually attempt that but he considered hanging himself.  He did not seek treatment.  Reports this was 4-6 months ago.  He denies any history of psychiatric inpatient admissions.  He contracts for safety.  Denies access to firearms/weapons.   Discussed CD IOP programs with patient.  Patient is willing to participate.  Outpatient resources were provided.  Patient denies any health concerns at this time.  Total Time spent with patient: 30 minutes  Psychiatric Specialty Exam: Physical Exam Vitals and nursing note reviewed.  Constitutional:      Appearance: Normal appearance. He is normal weight.  HENT:     Head: Normocephalic and atraumatic.     Nose: Nose normal.  Eyes:     General:        Right eye: No discharge.        Left eye: No discharge.     Conjunctiva/sclera: Conjunctivae normal.  Cardiovascular:     Rate and Rhythm: Normal rate.  Pulmonary:     Effort: Pulmonary effort is normal. No respiratory distress.  Musculoskeletal:        General: Normal range of motion.     Cervical back: Normal range of motion.  Skin:    Coloration: Skin is not jaundiced or pale.  Neurological:     Mental Status: He is alert and oriented to person, place, and time.  Psychiatric:  Attention and Perception: Attention and perception normal.        Mood and Affect: Mood is anxious and depressed.        Speech: Speech normal.        Behavior: Behavior normal. Behavior is cooperative.        Thought Content: Thought content normal.        Cognition and Memory: Cognition normal.        Judgment: Judgment is impulsive.   Review of Systems  HENT: Negative.    Eyes: Negative.   Respiratory: Negative.    Cardiovascular: Negative.   Gastrointestinal: Negative.   Endocrine: Negative.   Musculoskeletal: Negative.   Skin: Negative.    Neurological: Negative.   Psychiatric/Behavioral:  Positive for decreased concentration. The patient is nervous/anxious.   Blood pressure (!) 129/98, pulse (!) 101, temperature 98.1 F (36.7 C), temperature source Oral, resp. rate 19, SpO2 100 %.There is no height or weight on file to calculate BMI. General Appearance: Well Groomed Eye Contact:  Fair Speech:  Clear and Coherent and Normal Rate Volume:  Normal Mood:  Anxious and Depressed Affect:  Congruent Thought Process:  Coherent Orientation:  Full (Time, Place, and Person) Thought Content:  Logical Suicidal Thoughts:  No Homicidal Thoughts:  No Memory:  Immediate;   Good Recent;   Good Remote;   Good Judgement:  Fair Insight:  Fair Psychomotor Activity:  Normal Concentration: Concentration: Good and Attention Span: Good Recall:  Good Fund of Knowledge:Good Language: Good Akathisia:  No Handed:  Right AIMS (if indicated):   n/a Assets:  Communication Skills Desire for Improvement Financial Resources/Insurance Housing Intimacy Physical Health Resilience Social Support Transportation Vocational/Educational Sleep:     Musculoskeletal: Strength & Muscle Tone: within normal limits Gait & Station: normal Patient leans: N/A  Blood pressure (!) 129/98, pulse (!) 101, temperature 98.1 F (36.7 C), temperature source Oral, resp. rate 19, SpO2 100 %.  Recommendations: Based on my evaluation the patient does not appear to have an emergency medical condition.  Patient does not meet criteria for inpatient psychiatric admission.  Patient will be discharged. Provided resources for [employer] [facility] CD IOP, [facility] CDIOP, [place] of care CD IOP and [place].   Patient declined resources for residential substance abuse treatment.  Discussed if symptoms were to worsen he can also present for assessment at the [facility].  Revonda Humphrey, NP 05/20/2021, 5:08 PM

## 2021-05-20 NOTE — Progress Notes (Signed)
CSW added SUD resources to AVS.  Maryjean Ka, MSW, [facility] 05/20/2021 4:35 PM

## 2021-05-20 NOTE — BH Assessment (Signed)
Patient is a 35 year old male that presents this date requesting referrals for a CDIOP program. Patient denies any S/I, H/I or AVH. Patient denies any current mental health diagnosis or history of OP care. Patient states he has been maintaining his sobriety for over two weeks from methamphetamines and is requesting information in the form of SA groups to assist with ongoing recovery. Patient was seen and evaluated by Effie Shy NP who psychiatrically cleared patient and requested patient be provided resources. Patient was given information on area resources to include [facility], [facility] [place] and [place].         Effie Shy NP writes this date: Jonathon Mendez, 35 y.o., male patient seen face to face by this provider, consulted with Dr. Gasper Sells; and chart reviewed on 05/20/21.  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  Reports he has used methamphetamine to cope with his depression and anxiety throughout the years.  He stopped using abruptly 2 weeks ago.  Reports he was using 20-30 gm of methamphetamines per week.  Denies any other substance use.  He currently does not take any medications.  He currently does not have an outpatient psychiatric provider in place.  Reports he saw a therapist a long time ago at [facility]	[place] and wellness.   During evaluation Jonathon Mendez is in sitting position in no acute distress.  He is well-groomed.  He makes fair eye contact.  His speech is clear, coherent, normal rate and tone.  He is anxious.  He taps his foot on the floor and switches his locker key from hand to hand.  He denies any withdrawal symptoms other than feeling anxious.He is alert/oriented x 4. He is cooperative. He endorses increased depression and anxiety after he stopped using mthamphetamines two weeks ago. He endorses feelings of guilt, worthlessness, irritability, tearfulness, isolation, and fatigue.  Denies any concerns with appetite.   Reports he sleeps anywhere from 4 to 8 hours per night.  States it is difficult for him to sleep because he is used to working third shift.  Reports he has taken off from work since 12/26 due to his inability to focus.  Objectively there is no evidence of psychosis/mania or delusional thinking.  He does not appear to be responding to internal/external stimuli.  He is forward thinking.  He is goal-directed and discusses his willingness to participate in an intensive program.  He denies SI/HI/AVH.  Reports 1 suicide attempt in the past.  States he did not actually attempt that but he considered hanging himself.  He did not seek treatment.  Reports this was 4-6 months ago.  He denies any history of psychiatric inpatient admissions.  He contracts for safety.  Denies access to firearms/weapons.    Discussed CD IOP programs with patient.  Patient is willing to participate.  Outpatient resources were provided.  Patient denies any health concerns at this time.

## 2021-05-24 ENCOUNTER — Telehealth (HOSPITAL_COMMUNITY): Payer: Self-pay | Admitting: Licensed Clinical Social Worker

## 2021-05-26 ENCOUNTER — Ambulatory Visit (HOSPITAL_COMMUNITY): Payer: Managed Care, Other (non HMO) | Admitting: Licensed Clinical Social Worker

## 2021-05-31 ENCOUNTER — Ambulatory Visit (INDEPENDENT_AMBULATORY_CARE_PROVIDER_SITE_OTHER): Payer: 59 | Admitting: Licensed Clinical Social Worker

## 2021-05-31 ENCOUNTER — Other Ambulatory Visit: Payer: Self-pay

## 2021-05-31 DIAGNOSIS — F1994 Other psychoactive substance use, unspecified with psychoactive substance-induced mood disorder: Secondary | ICD-10-CM

## 2021-05-31 DIAGNOSIS — F1520 Other stimulant dependence, uncomplicated: Secondary | ICD-10-CM | POA: Diagnosis not present

## 2021-05-31 DIAGNOSIS — F1010 Alcohol abuse, uncomplicated: Secondary | ICD-10-CM

## 2021-05-31 DIAGNOSIS — F1210 Cannabis abuse, uncomplicated: Secondary | ICD-10-CM | POA: Diagnosis not present
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
## 2021-06-01 ENCOUNTER — Other Ambulatory Visit (HOSPITAL_COMMUNITY): Payer: 59 | Attending: Psychiatry | Admitting: Licensed Clinical Social Worker

## 2021-06-01 ENCOUNTER — Encounter (HOSPITAL_COMMUNITY): Payer: Self-pay | Admitting: Medical

## 2021-06-01 DIAGNOSIS — Z6379 Other stressful life events affecting family and household: Secondary | ICD-10-CM | POA: Insufficient documentation

## 2021-06-01 DIAGNOSIS — F410 Panic disorder [episodic paroxysmal anxiety] without agoraphobia: Secondary | ICD-10-CM | POA: Diagnosis not present

## 2021-06-01 DIAGNOSIS — Z87898 Personal history of other specified conditions: Secondary | ICD-10-CM

## 2021-06-01 DIAGNOSIS — G4700 Insomnia, unspecified: Secondary | ICD-10-CM | POA: Insufficient documentation

## 2021-06-01 DIAGNOSIS — F1994 Other psychoactive substance use, unspecified with psychoactive substance-induced mood disorder: Secondary | ICD-10-CM | POA: Diagnosis not present

## 2021-06-01 DIAGNOSIS — Z6372 Alcoholism and drug addiction in family: Secondary | ICD-10-CM | POA: Diagnosis not present

## 2021-06-01 DIAGNOSIS — F1520 Other stimulant dependence, uncomplicated: Secondary | ICD-10-CM | POA: Diagnosis not present

## 2021-06-01 DIAGNOSIS — Z811 Family history of alcohol abuse and dependence: Secondary | ICD-10-CM | POA: Diagnosis not present

## 2021-06-01 DIAGNOSIS — F419 Anxiety disorder, unspecified: Secondary | ICD-10-CM | POA: Insufficient documentation

## 2021-06-01 DIAGNOSIS — F32A Depression, unspecified: Secondary | ICD-10-CM | POA: Insufficient documentation

## 2021-06-01 DIAGNOSIS — Z8659 Personal history of other mental and behavioral disorders: Secondary | ICD-10-CM

## 2021-06-01 DIAGNOSIS — Z62812 Personal history of neglect in childhood: Secondary | ICD-10-CM | POA: Insufficient documentation

## 2021-06-01 DIAGNOSIS — F17210 Nicotine dependence, cigarettes, uncomplicated: Secondary | ICD-10-CM | POA: Diagnosis not present

## 2021-06-01 DIAGNOSIS — F1010 Alcohol abuse, uncomplicated: Secondary | ICD-10-CM

## 2021-06-01 DIAGNOSIS — F909 Attention-deficit hyperactivity disorder, unspecified type: Secondary | ICD-10-CM | POA: Insufficient documentation

## 2021-06-01 DIAGNOSIS — Z813 Family history of other psychoactive substance abuse and dependence: Secondary | ICD-10-CM | POA: Diagnosis not present

## 2021-06-01 DIAGNOSIS — F1210 Cannabis abuse, uncomplicated: Secondary | ICD-10-CM | POA: Insufficient documentation

## 2021-06-01 DIAGNOSIS — Z62810 Personal history of physical and sexual abuse in childhood: Secondary | ICD-10-CM | POA: Insufficient documentation

## 2021-06-01 DIAGNOSIS — F515 Nightmare disorder: Secondary | ICD-10-CM | POA: Insufficient documentation

## 2021-06-01 DIAGNOSIS — F4312 Post-traumatic stress disorder, chronic: Secondary | ICD-10-CM | POA: Diagnosis not present

## 2021-06-01 DIAGNOSIS — Z9189 Other specified personal risk factors, not elsewhere classified: Secondary | ICD-10-CM

## 2021-06-01 MED ORDER — BACLOFEN 10 MG PO TABS: 10.0000 mg | ORAL_TABLET | Freq: Three times a day (TID) | ORAL | 1 refills | Status: DC

## 2021-06-01 MED ORDER — ATOMOXETINE HCL 80 MG PO CAPS: 80.0000 mg | ORAL_CAPSULE | Freq: Every day | ORAL | 2 refills | Status: DC
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
## 2021-06-02 ENCOUNTER — Other Ambulatory Visit: Payer: Self-pay

## 2021-06-02 ENCOUNTER — Other Ambulatory Visit (HOSPITAL_COMMUNITY): Payer: 59 | Admitting: Licensed Clinical Social Worker

## 2021-06-02 DIAGNOSIS — F1520 Other stimulant dependence, uncomplicated: Secondary | ICD-10-CM

## 2021-06-02 DIAGNOSIS — F1994 Other psychoactive substance use, unspecified with psychoactive substance-induced mood disorder: Secondary | ICD-10-CM

## 2021-06-06 ENCOUNTER — Telehealth (HOSPITAL_COMMUNITY): Payer: Self-pay | Admitting: Licensed Clinical Social Worker

## 2021-06-06 ENCOUNTER — Encounter (HOSPITAL_COMMUNITY): Payer: 59

## 2021-06-08 ENCOUNTER — Telehealth (HOSPITAL_COMMUNITY): Payer: Self-pay | Admitting: Licensed Clinical Social Worker

## 2021-06-08 ENCOUNTER — Encounter (HOSPITAL_COMMUNITY): Payer: 59

## 2021-06-09 ENCOUNTER — Other Ambulatory Visit: Payer: Self-pay

## 2021-06-09 ENCOUNTER — Other Ambulatory Visit (HOSPITAL_COMMUNITY): Payer: 59 | Admitting: Licensed Clinical Social Worker

## 2021-06-09 DIAGNOSIS — F1520 Other stimulant dependence, uncomplicated: Secondary | ICD-10-CM | POA: Diagnosis not present

## 2021-06-09 DIAGNOSIS — F1994 Other psychoactive substance use, unspecified with psychoactive substance-induced mood disorder: Secondary | ICD-10-CM

## 2021-06-09 NOTE — Progress Notes (Signed)
° ° °  Daily Group Progress Note  Program: CD-IOP   Group Time: 1pm-2:30pm Participation Level: Active Behavioral Response: Appropriate Type of Therapy: Process Group Topic: Clinician checked in with group members, assessing for SI/HI/psychosis and overall level of functioning including difficulty maintaining sobriety and engagement in [employer]. Clinician and group members processed any challenges to sobriety since previous group and ways to cope with difficult upcoming events. Clinician and group members read and processed Daily Reflections from [place] and NA in relation to their place in recovery. Clinician and clients reviewed clean mind/ clear mind DBT worksheet and discussed possible dangerous thoughts/behaviors identified since previous session.   Group Time: 2:30pm-4pm Participation Level: Active Behavioral Response: Appropriate Type of Therapy: Psycho-education Group Topic: Clinician presented psycho-educational video, part of a series Your Body On Drugs. Clinician and group members watched video and discussed follow up thoughts. Clinician and group processed client thoughts related to personal behavior while intoxicated of which they were reminded while watching video. Clients shared positive comments others have said about changes in behaviors and attitudes since stopping substance use of which they were proud. Clinician requested clients identify a self care activity to be completed to support recovery prior to next group.   Summary: Client checked in showing progress toward goal AEB maintained sobriety since previous session. Client requires continued support on building sober support system AEB no attendance at [facility] meetings since previous session. Client engaged appropriately with group, processing recent triggers and being receptive to feedback from clinician and group member. Client was attentive during psycho-education and identified similar experiences and responses  during previous sober time.   Family Program: Family present? No   Name of family member(s): NA  UDS collected: No Results: NA  AA/NA attended?: No  Sponsor?: No   Micheline Chapman, LCSW, LCAS

## 2021-06-13 ENCOUNTER — Other Ambulatory Visit (HOSPITAL_COMMUNITY): Payer: 59 | Admitting: Medical

## 2021-06-13 ENCOUNTER — Other Ambulatory Visit: Payer: Self-pay

## 2021-06-13 DIAGNOSIS — F1520 Other stimulant dependence, uncomplicated: Secondary | ICD-10-CM

## 2021-06-13 DIAGNOSIS — Z8659 Personal history of other mental and behavioral disorders: Secondary | ICD-10-CM

## 2021-06-13 DIAGNOSIS — Z87898 Personal history of other specified conditions: Secondary | ICD-10-CM

## 2021-06-13 DIAGNOSIS — F1210 Cannabis abuse, uncomplicated: Secondary | ICD-10-CM

## 2021-06-13 DIAGNOSIS — F1010 Alcohol abuse, uncomplicated: Secondary | ICD-10-CM

## 2021-06-13 DIAGNOSIS — Z9189 Other specified personal risk factors, not elsewhere classified: Secondary | ICD-10-CM

## 2021-06-13 DIAGNOSIS — F1994 Other psychoactive substance use, unspecified with psychoactive substance-induced mood disorder: Secondary | ICD-10-CM

## 2021-06-13 DIAGNOSIS — Z6372 Alcoholism and drug addiction in family: Secondary | ICD-10-CM

## 2021-06-13 DIAGNOSIS — F17210 Nicotine dependence, cigarettes, uncomplicated: Secondary | ICD-10-CM

## 2021-06-13 MED ORDER — TRAZODONE HCL 50 MG PO TABS: 50.0000 mg | ORAL_TABLET | Freq: Every evening | ORAL | 0 refills | Status: AC | PRN

## 2021-06-13 MED ORDER — BACLOFEN 10 MG PO TABS
10.0000 mg | ORAL_TABLET | Freq: Three times a day (TID) | ORAL | 1 refills | Status: AC
Start: 2021-06-13 — End: 2022-06-13

## 2021-06-13 MED ORDER — ATOMOXETINE HCL 80 MG PO CAPS: 80.0000 mg | ORAL_CAPSULE | Freq: Every day | ORAL | 2 refills | Status: DC
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
## 2021-06-13 NOTE — Progress Notes (Signed)
° ° °  Daily Group Progress Note  Program: CD-IOP   Group Time: 1pm-2:30pm Participation Level: Active Behavioral Response: Appropriate Type of Therapy: Process Group Topic: Clinician checked in with group members, assessing for SI/HI/psychosis and overall level of functioning, including cravings or triggers and coping skills used to deal with uncomfortable feelings. Clinician inquired about attendance at community support meetings and connection with sober support system. Clinician and group members discussed what went well and challenges to sobriety. Clinician facilitated group processing of triggers and relapses since previous session and common struggles in early sobriety, such as wanting the instant gratification, acceptance of physical/mental/emotional states sober in comparison to expectations and related disappointments or resentments.  Clinician and group members read and processed AA daily reflection and NA JFT.   Group Time: 2:30pm-4pm Participation Level: Active Behavioral Response: Appropriate Type of Therapy: Psycho-education Group Topic: Clinician presented the psycho-educational topic of Adult Children of Alcoholics (ACOA). Clinician reviewed with clients the 'Laundry List' and common thought and behavior patterns of families with substance use in the home. Clinician and group members discussed how this affected believes/behaviors in work, intimate and interpersonal relationships. Clinician and group members reviewed how to address distorted thinking in relationships with family members. Clinician provided clients with '10 Things Adult Children of Addicts Wants You To Know. Clinician inquired about self care activity planned before next session.   Summary: Client presented late, citing trouble again with transportation. Client showed progress toward goal AEB maintained sobriety since previous December. Client requires continued support of building sober support system AEB no  attendance at [employer] and continued isolation from peers. Client processed with group frustration with lack of healthy communication with girlfriend and feelings around accusations of use when not using. Client was receptive to I-statements and support from group members. Client was receptive to psycho-education and identified how similar behaviors existed in personal family as well as girlfriends family. Client processed frustration with "wanting to use and not wanting to use" and lack of understanding from non-addicts. Client self care activity planning to attend at least once meeting prior to next session.   Family Program: Family present? No   Name of family member(s): NA  UDS collected: No Results: positive for prescribed substances only from previous week.  AA/NA attended?: No  Sponsor?: No   Micheline Chapman, LCSW, LCAS

## 2021-06-15 ENCOUNTER — Other Ambulatory Visit: Payer: Self-pay

## 2021-06-15 ENCOUNTER — Encounter (HOSPITAL_COMMUNITY): Payer: Self-pay | Admitting: Medical

## 2021-06-15 ENCOUNTER — Other Ambulatory Visit (HOSPITAL_COMMUNITY): Payer: 59 | Admitting: Licensed Clinical Social Worker

## 2021-06-15 DIAGNOSIS — F1520 Other stimulant dependence, uncomplicated: Secondary | ICD-10-CM

## 2021-06-15 DIAGNOSIS — F1994 Other psychoactive substance use, unspecified with psychoactive substance-induced mood disorder: Secondary | ICD-10-CM

## 2021-06-16 ENCOUNTER — Other Ambulatory Visit (HOSPITAL_COMMUNITY): Payer: 59

## 2021-06-16 NOTE — Progress Notes (Signed)
° ° °  Daily Group Progress Note  Program: CD-IOP   Group Time: 1pm-2:30pm Participation Level: Active Behavioral Response: Appropriate and Sharing Type of Therapy: Process Group Topic: Clinician checked in with group members, assessing for SI/HI/psychosis and overall level of functioning including difficulties with cravings or relapse. Clinician and group members processed 'highs and lows' since last group. Group members were provided time to process challenges to recovery and receive feedback from group members. Clinician and group members read AA Daily Reflection and Just for Today. Clinician and group members completed mindfulness activity.   Group Time: 2:30pm-4pm Participation Level: Active Behavioral Response: Appropriate and Sharing Type of Therapy: Psychoeducational Group Topic  Clinician reviewed with clients the CBT triangle and connection of thoughts, feelings, and behaviors. Clinician and group members practiced in session identifying differences in thoughts, feelings, and behaviors in session and practiced creating alternative thoughts. Clinician presented the topic of resentment and its possible effect on relapse in recovery. Clinician and group members discussed situations leading to resentment, additional feelings, thoughts, and behaviors related to resentment. Clinician and group members identified core believes and processed how a violation of these by self or others can lead to guilt or resentment. Clinician and group members processed violating boundaries is more likely when actively using substances. Clinician provided clients with homework to review forgiveness section.    Summary: Client checked in, arriving late, did not endorse SI/HI, did not appear psychotic or intoxicated at the time of appointment. Client reported a sobriety date of 06/10/20, showing no use since previous session. Client requires continued support AEB no utilization of NA community and isolating to  avoid triggers rather than attempt new coping skills. Client processed ongoing frustration with feeling accused of using by fiancee. Client processed concern with connecting and reaching out to not want to feel like a burden to others. Client worked to complete worksheet on identifying resentments around blame however continues to struggle with negative self talk and shame related to behaviors while in active addiction.   Family Program: Family present? No   Name of family member(s): NA  UDS collected: No Results: pending  AA/NA attended?: No  Sponsor?: No   Micheline Chapman, LCSW, LCAS

## 2021-06-19 NOTE — Progress Notes (Unsigned)
° ° °  Daily Group Progress Note  Program: {[facility] AMB BH IOP/CDIOP Program Type:21022744}   Group Time: 1-2:30pm  Participation Level: Active   Behavioral Response: Appropriate  Type of Therapy: Process Group     Topic: Clinician checked in with group members, assessing for SI/HI/psychosis and overall level of functioning, including cravings, relapse, and participation in community support meetings. Clinician to identify and process highs and lows since last group. Group processed any challenges or struggles experienced which impacted their recovery and skills used to address. Clinician and group members normalized relapse as possible part of recovery and provided encouragement to group members for honesty around cravings and use. Clinician facilitated group discussion around NA Just for today with a focus on being mindful of defensiveness related to growth and change.     Group Time: 2:30-4pm  Participation Level: Active  Behavioral Response: Appropriate  Type of Therapy: Psycho-educational Group  Clinician presented Addiction Neuroscience 101 video from [place] [facility]. Clinician and group members discussed scientific link between addiction, dopamine, and motivation. Clinician and group members discussed examples of changes in motivation levels in their own recovery. Clinician and group members brain stormed personal activities to engage in which increase moments of joy, stimulating the pleasure center of the brain. Clinician reviewed diagnosis criteria and decision fatigue based on information from video. Clinician provided the skill of Opposite Action. Group members identified self-care activity to support recovery to be completed before next session.  Summary: Client arrived late, citing trouble with transportation. Client showed some rp   Family Program: Family present? {BHH YES OR NO:22294}   Name of family member(s): ***  UDS collected: {BHH YES OR NO:22294}  Results: {Findings; urine drug screen:60936}  AA/NA attended?: {BHH YES OR NO:22294}{DAYS OF NN:2940888  Sponsor?: {BHH YES OR NO:22294}   BH-CIOPB CHEM

## 2021-06-19 NOTE — Progress Notes (Signed)
° ° °  Daily Group Progress Note  Program: CD-IOP   Group Time: 1pm-2:30pm Participation Level: Active Behavioral Response: Appropriate, Sharing and Assertive Type of Therapy: Process Group Topic: Clinician checked in with group members, assessing for SI/HI/psychosis and overall level of functioning. Clinician processed with client recent successes and any barriers to recovery. Clinician and group members processed recent struggles to sobriety and skills used to address relapse. Clinician and group members read AA and NA Daily Reflection     Group Time: 2:30pm-4pm Participation Level: Active Behavioral Response: Appropriate Type of Therapy: Process Group/psycho-education provided Topic: Clinician presented psycho-education on "Four Most Common Reasons for Relapse in Early Recovery." Clinician and group discussed physical cravings, psychological longing, physical pain, and emotional pain as triggers for thoughts followed by possible cravings and use. Clients processed struggles in early recovery with different types of conditions and surprise triggers. Clinician reminded group members the importance of having a relapse prevention plan in place and utilizing plan immediately. Clinician worked with clients to review trigger-thought-craving-use cycle and group identified appropriate places for different types of interventions. Clinician reviewed TIPP skill. Clinician and group members reviewed Five Common Challenges in Early Recovery from [facility] Intensive outpatient client workbook. Clinician inquired about self-care activity to support recovery before next group.   Summary: Client presented for first session. Client showed progress toward goal of maintained sobriety 7/7 days weekly AEB sobriety date remaining in December of 2022. Client processed stressors including relationships and work in addition to mood swings/short temper increased when sober. Client was receptive to psycho-education and  identified several people/places/things recently changed and others to be mindful of in the future. Client met with PA, see not for additional information.   Family Program: Family present? No   Name of family member(s): NA  UDS collected: Yes Results: nicotine  AA/NA attended?: No  Sponsor?: No   Olegario Messier, LCSW

## 2021-06-20 ENCOUNTER — Other Ambulatory Visit (HOSPITAL_COMMUNITY): Payer: 59 | Admitting: Licensed Clinical Social Worker

## 2021-06-20 ENCOUNTER — Other Ambulatory Visit: Payer: Self-pay

## 2021-06-20 DIAGNOSIS — F1994 Other psychoactive substance use, unspecified with psychoactive substance-induced mood disorder: Secondary | ICD-10-CM

## 2021-06-20 DIAGNOSIS — F1520 Other stimulant dependence, uncomplicated: Secondary | ICD-10-CM

## 2021-06-22 ENCOUNTER — Other Ambulatory Visit (HOSPITAL_COMMUNITY): Payer: 59 | Attending: Psychiatry | Admitting: Licensed Clinical Social Worker

## 2021-06-22 DIAGNOSIS — F1990 Other psychoactive substance use, unspecified, uncomplicated: Secondary | ICD-10-CM | POA: Insufficient documentation

## 2021-06-22 DIAGNOSIS — F1190 Opioid use, unspecified, uncomplicated: Secondary | ICD-10-CM | POA: Insufficient documentation

## 2021-06-22 DIAGNOSIS — F1520 Other stimulant dependence, uncomplicated: Secondary | ICD-10-CM

## 2021-06-22 DIAGNOSIS — F1994 Other psychoactive substance use, unspecified with psychoactive substance-induced mood disorder: Secondary | ICD-10-CM

## 2021-06-23 ENCOUNTER — Other Ambulatory Visit (HOSPITAL_COMMUNITY): Payer: 59 | Admitting: Licensed Clinical Social Worker

## 2021-06-23 ENCOUNTER — Other Ambulatory Visit: Payer: Self-pay

## 2021-06-23 DIAGNOSIS — F1520 Other stimulant dependence, uncomplicated: Secondary | ICD-10-CM

## 2021-06-23 DIAGNOSIS — F1190 Opioid use, unspecified, uncomplicated: Secondary | ICD-10-CM | POA: Diagnosis not present

## 2021-06-23 DIAGNOSIS — F1994 Other psychoactive substance use, unspecified with psychoactive substance-induced mood disorder: Secondary | ICD-10-CM

## 2021-06-23 NOTE — Progress Notes (Signed)
° ° °  Daily Group Progress Note  Program: CD-IOP   Group Time: 1pm-2pm Participation Level: Active Behavioral Response: Appropriate Type of Therapy: Psycho-education Group Topic: Psychoeducational presentation co-facilitated by Cleta Alberts. from [place]. STI and HIV psychoeducation was provided to clients. Co-facilitator provided local resources for testing and treatment. Clients were provided with time to ask questions and discuss local resources.   Group Time: 2-4  Participation Level: Active  Behavioral Response: Appropriate and Sharing  Type of Therapy: Process Group  Topic: Clinician checked in with clients, assessing for SI/HI/psychosis and overall level of functioning including cravings, barriers to sobriety, and highlights when skills were successfully used. Clinician and group processed Daily Reflection and Just For Today with a focus on sanity and hardships. Clinician processed with group transition in group leaders. Clinician provided guided visualization meditation. Clinician and clients reviewed and processed 6 Common Fears in early recovery.  Summary: Client arrived late due to transportation. Client checked in with overall improved mood and decreased arguing with partner. Client showed progress toward goal AEB maintained sobriety date of 06/10/20 however requires continued support on building sober support system AEB no attendance at [employer]. Client missed majority of psycho-education presentation by co-facilitator. Client was minimally engaged with thoughts around daily reflection however identified similarities around personal thoughts/behaviors related to fears in early recovery.   Family Program: Family present? No   Name of family member(s): NA  UDS collected: No Results: pending  AA/NA attended?: No  Sponsor?: No   Micheline Chapman, LCSW, LCAS

## 2021-06-23 NOTE — Progress Notes (Signed)
° ° °  Daily Group Progress Note  Program: CD-IOP   Group Time: 1-230 Participation Level: Active Behavioral Response: Appropriate and Sharing Type of Therapy: Process Group Topic: Clinician checked in with clients, assessing for SI/HI/psychosis and overall level of functioning, including sobriety dates and number of support groups attended since last group. Clinician and group members processed highlights and challenges since last group session. Clients processed barriers to sobriety. Clinician and group members read and processed AA Daily Reflection and NA Just for Today in relation to current space in recovery. Clinician and group members worked with identifying specific events paired with specific emotions. Clinician and group members processed the difficulty identifying events to link with "positive" emotions but ease of "difficult" emotions. Clinician encouraged clients to be mindful of labeling emotions.   Group Time: 230-4 Participation Level: Active Behavioral Response: Appropriate Type of Therapy: Psycho-education Group Topic: Clinician provided mindfulness activity. Clinician presented [name] and group members reviewed cognitive triangle and connection of thoughts, feelings, and behaviors. Clinician facilitated discussion on where feelings are physically felt in the body and group completed body mapping activity. Group discussed needing different skills to address different emotions or body sensations. Clinician reviewed butterfly tapping and pretzel skills for grounding as well as the importance of sitting with an emotion for several minutes rather than automatic distraction, similar to addictive behavior patterns. Clinician inquired an activity to be completed to support recovery prior to next session.   Summary: Client checked in, late reportedly due to transportation, with sobriety date self reported of 06/10/21. Client reported overall success over the weekend with  no big arguments with partner. Client requires continued support to build sober support community AEB no attendance at [employer] since previous session. Client discussed how emotions were displayed and received growing up and the effect on expressing emotions now. Client shared differences with partners expressing emotions. Client participated in tapping skill for physical regulation of emotions when feeling escalated.   Family Program: Family present? No   Name of family member(s): NA  UDS collected: Yes Results: pending  AA/NA attended?: No  Sponsor?: No   Micheline Chapman, LCSW, LCAS

## 2021-06-24 ENCOUNTER — Other Ambulatory Visit: Payer: Self-pay

## 2021-06-27 ENCOUNTER — Encounter (HOSPITAL_COMMUNITY): Payer: 59

## 2021-06-28 NOTE — Progress Notes (Signed)
° ° °  Daily Group Progress Note  Program: CD-IOP   Group Time: 1pm-2:30pm Participation Level: Active Behavioral Response: Appropriate Type of Therapy: Process Group Topic: Clinician met with clients, assessing for SI/HI/psychosis and overall level of functioning including attendance of recovery meetings and relapse or challenges to sobriety. Clinician and group members discussed highs and lows from previous days and reflection on topic from previous day. Clinician and group members read Daily Reflection and clinician facilitated discussion on fear and denial in recovery vs acceptance and willingness for change.     Group Time: 2:30pm-4pm Participation Level: Active Behavioral Response: Appropriate Type of Therapy: Psycho-education Group Topic: Clinician provided psycho-educational group on PAWS. Clinician provided supplemental video with discussion on common PAWS symptoms and skills to help with management. Clinician utilized Post Acute Withdrawal (PAW) Self-Evaluation developed by Patrina Levering. Clinician and group members discussed the importance of being aware and tracking symptoms during recovery. Clinician provided supplemental video of information related to PAWS, common triggers, and relaxation techniques for management of symptoms. Clinician provided exercises for mindfulness as an alternative to meditations.    Summary: Client presented late, citing transportation complications. Client showed progress toward goal AEB reported sobriety date of 06/10/21. Client requires continued support to build a sober support system AEB no attendance at [employer] since previous session. Client provided multiple reasons why not attended virtually and later acknowledged as excuses. Client was receptive to psycho-education and identified previous triggers for relapse.   Family Program: Family present? No   Name of family member(s): NA  UDS collected: No Results: UDS from 06/20/21 positive for  methamphetamines and Dextromethorphan/Levorphanol ;  Of note, UDS results obtained after group  AA/NA attended?: No  Sponsor?: No   Micheline Chapman, LCSW, LCAS

## 2021-06-29 ENCOUNTER — Telehealth (HOSPITAL_COMMUNITY): Payer: Self-pay | Admitting: Licensed Clinical Social Worker

## 2021-06-29 ENCOUNTER — Encounter (HOSPITAL_COMMUNITY): Payer: 59

## 2021-06-30 ENCOUNTER — Encounter (HOSPITAL_COMMUNITY): Payer: 59

## 2021-07-01 ENCOUNTER — Telehealth (HOSPITAL_COMMUNITY): Payer: Self-pay | Admitting: Licensed Clinical Social Worker

## 2021-07-04 ENCOUNTER — Other Ambulatory Visit (HOSPITAL_COMMUNITY): Payer: 59 | Admitting: Licensed Clinical Social Worker

## 2021-07-04 ENCOUNTER — Other Ambulatory Visit: Payer: Self-pay

## 2021-07-04 DIAGNOSIS — F1520 Other stimulant dependence, uncomplicated: Secondary | ICD-10-CM

## 2021-07-04 DIAGNOSIS — F1190 Opioid use, unspecified, uncomplicated: Secondary | ICD-10-CM | POA: Diagnosis not present

## 2021-07-04 NOTE — Progress Notes (Signed)
Daily Group Progress Note   Program: CD IOP     Group Time: 1:30 pm. to 2:30 pm   Participation Level: Active   Behavioral Response: Appropriate   Type of Therapy: Process   Topic: The therapist has members discuss what has gone well in their recovery including challenges to recovery. The therapist talks to members about the prospect of group moving from afternoons to mornings and being held on Mondays, Wednesdays, and Fridays as opposed to Monday, Wednesdays, and Thursdays. The therapist introduces a new group member to group and says goodbye to a member whose last day is today         Group Time: 2:30 pm to 4 pm   Participation Level: Active   Behavioral Response: Appropriate   Type of Therapy: Psychoeducational   Topic: The therapist presented Session 1: Stop the Cycle from the [place] discussing what are triggers; educating clients on the trigger-thought-craving-use cycle and how to use thought stopping techniques to interrupt this cycle; and explaining to clients the importance of scheduling.      Summary: Jonathon Mendez presents 30 minutes late for group. He says that his previous sobriety date was 05/30/21; however, his new sobriety date is 06/30/21. He talks about being angry at himself for having relapsed and apparently did not attend group last week due to feelings of anger, guilt, and shame, The therapist reframes a relapse as an opportunity for learning and points out how it is counterproductive to beat up on himself.  Jonathon Mendez says that his biggest triggers are arguing with his fiance and worrying. Jonathon Mendez apparently gets calls from family members, such as his mother, wanting to borrow money. His fiance and group have encouraged him to stop lending this money; however, Jonathon Mendez notes that if he does not lend the money that he will become so anxious and worried that it can trigger a relapse. He also talks about how it is difficult to be out in public when not under the influence as he will  feel "out of place." The therapist normalizes this in early recovery and encourages Jonathon Mendez to find situations for social exposure that are minimally anxiety-provoking vs taking on too much. 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 He says that he messaged the PA for the IOP about getting seen again. He wants to continue getting his ADHD treated with a non-stimulant option.      Family Program: Family present? No              UDS collected: YES Results:    AA/NA attended?: Yes; one meeting Sponsor?: Not known  Adam Phenix, MA, LCSW, [facility], [place]

## 2021-07-06 ENCOUNTER — Other Ambulatory Visit (HOSPITAL_COMMUNITY): Payer: 59 | Admitting: Licensed Clinical Social Worker

## 2021-07-06 ENCOUNTER — Other Ambulatory Visit (HOSPITAL_COMMUNITY): Payer: Self-pay | Admitting: Medical

## 2021-07-06 ENCOUNTER — Other Ambulatory Visit: Payer: Self-pay

## 2021-07-06 MED ORDER — ATOMOXETINE HCL 80 MG PO CAPS: 80.0000 mg | ORAL_CAPSULE | Freq: Every day | ORAL | 2 refills | Status: DC

## 2021-07-06 NOTE — Progress Notes (Signed)
Pt advised Counselor wanted re Rx [place], RX sent

## 2021-07-06 NOTE — Progress Notes (Incomplete)
hart

## 2021-07-07 ENCOUNTER — Encounter (HOSPITAL_COMMUNITY): Payer: 59

## 2021-07-08 ENCOUNTER — Telehealth (HOSPITAL_COMMUNITY): Payer: Self-pay | Admitting: Licensed Clinical Social Worker

## 2021-07-08 NOTE — Telephone Encounter (Signed)
The therapist sends a letter to Jonathon Mendez letting him know that he tried to reach him by phone leaving a voicemail. Therapist informs Brighton that he needs to call to schedule an individual meeting with this therapist prior to returning to group. [name], MA, LCSW, LCMHC, LCAS

## 2021-07-11 ENCOUNTER — Other Ambulatory Visit (HOSPITAL_COMMUNITY): Payer: 59 | Admitting: Medical

## 2021-07-11 DIAGNOSIS — F1520 Other stimulant dependence, uncomplicated: Secondary | ICD-10-CM

## 2021-07-11 DIAGNOSIS — F1190 Opioid use, unspecified, uncomplicated: Secondary | ICD-10-CM | POA: Diagnosis not present
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
## 2021-07-13 ENCOUNTER — Other Ambulatory Visit (HOSPITAL_COMMUNITY): Payer: 59 | Admitting: Licensed Clinical Social Worker

## 2021-07-13 ENCOUNTER — Other Ambulatory Visit: Payer: Self-pay

## 2021-07-13 ENCOUNTER — Encounter (HOSPITAL_COMMUNITY): Payer: Self-pay | Admitting: Licensed Clinical Social Worker

## 2021-07-13 DIAGNOSIS — F1210 Cannabis abuse, uncomplicated: Secondary | ICD-10-CM

## 2021-07-13 DIAGNOSIS — F1994 Other psychoactive substance use, unspecified with psychoactive substance-induced mood disorder: Secondary | ICD-10-CM

## 2021-07-13 DIAGNOSIS — IMO0001 Reserved for inherently not codable concepts without codable children: Secondary | ICD-10-CM

## 2021-07-13 DIAGNOSIS — Z9189 Other specified personal risk factors, not elsewhere classified: Secondary | ICD-10-CM

## 2021-07-13 DIAGNOSIS — Z6372 Alcoholism and drug addiction in family: Secondary | ICD-10-CM

## 2021-07-13 DIAGNOSIS — F1010 Alcohol abuse, uncomplicated: Secondary | ICD-10-CM

## 2021-07-13 DIAGNOSIS — F17210 Nicotine dependence, cigarettes, uncomplicated: Secondary | ICD-10-CM

## 2021-07-13 DIAGNOSIS — F1190 Opioid use, unspecified, uncomplicated: Secondary | ICD-10-CM | POA: Diagnosis not present

## 2021-07-13 DIAGNOSIS — Z87898 Personal history of other specified conditions: Secondary | ICD-10-CM

## 2021-07-13 DIAGNOSIS — F1520 Other stimulant dependence, uncomplicated: Secondary | ICD-10-CM

## 2021-07-13 DIAGNOSIS — Z8659 Personal history of other mental and behavioral disorders: Secondary | ICD-10-CM

## 2021-07-13 MED ORDER — ATOMOXETINE HCL 100 MG PO CAPS: 100.0000 mg | ORAL_CAPSULE | Freq: Every day | ORAL | 2 refills | Status: DC

## 2021-07-13 NOTE — Progress Notes (Signed)
Daily Group Progress Note  Program: CD IOP   Group Time: 1:10 p.m. to 2:30 p.m.   Type of Therapy: Process  Topic: Counselor checked in with group members, assessed for SI/HI/psychosis and overall level of functioning. Counselor inquired about sobriety date and number of community support meetings attended since last session. Clinician and group members processed events that went well and any challenges to recovery. Clinician and group members explored coping skills used to address challenges and problem solved additional options for next time a similar challenge arises. Counselor facilitated group process primarily around the need for group members to build non-using supports and develop new hobbies and activities to replace time they previously spent using. Counselor utilized cognitive-behavioral and solution-focused interventions. Counselor encourages group members to present suggestions for any specific topics which they would like to be covered in group.   Group Time: 2:30 p.m. to 4 p.m.   Type of Therapy: Psychoeducational   Topic: Counselor presented Matrix topic ERS 4A on 12-Step Introduction covering the different types of meetings, how to find meetings, what a Sponsor does and how to find one, and alternatives to 12-Step Programs. Counselor shares handouts on The Twelve Steps in Plain English and The Spiritual Principles of AA's Twelve Steps eliciting feedback from group members regarding these handouts. Counselor illustrates for members what is meant by working a First Step with a Publishing copy by providing them with a copy of First [employer] and reviewing the questions asked on these worksheets and how they might help a person move from admitting to accepting. Counselor provides the homework of having group members attend one Twelve Step Meeting between now and Monday the 06/07/21 and returning to group to share their feedback good and bad.   Summary: Jonathon Mendez presents for  group and participates actively in today's group discussion. He rates his depression and anxiety both at 0 and denies any suicidal or homicidal ideation. He denies any drug use noting that he has the same sobriety date as before. He reports sleeping about 10 hours per night; however, he is apparently sleeping at times 12-14 hours waking up around 2-3 p.m. in the afternoon to try and stay asleep until his fianc gets home. He says that they are not arguing as he is agreeing to go out with her. He will go to a restaurant with her and just keep his head down to get through it. The Counselor expresses concerns about Jonathon Mendez only sleeping and playing video games while socially isolating. During the group discussion about alternatives to 12 Step Programs, Jonathon Mendez has an epiphany realizing that his fianc is trying to force him to get out to connect with the world and start having hobbies and activities. He says that he used to enjoy his gas-powered model cars and would go to events with other people with this hobby where no drugs or alcohol were consumed noting that he might need to get involved with this again. He says that he is willing to attend a 12 Step Meeting before next Monday saying that he would like to try one in person.   Progress Towards Goals: Per self-report, Jonathon Mendez is progressing reporting that he has not used in 10 days. Also, he has attended two groups back-to-back which is the first time he has done this since the 1st and 2nd of this month.   UDS collected: Yes  Results: No  AA/NA attended?: No  Sponsor?: No   Adam Phenix, MA, LCSW, [place], LCAS

## 2021-07-13 NOTE — Progress Notes (Signed)
° °  [facility] Follow-up Outpatient CDIOP Date:   Admission Date:  Sobriety date:  Subjective:   HPI   Review of Systems: Psychiatric: Agitation: No Hallucination: No Depressed Mood: Yes much better Insomnia: No Hypersomnia: No Altered Concentration: No Feels Worthless: No Grandiose Ideas: No Belief In Special Powers: No New/Increased Substance Abuse: No Compulsions: No  Neurologic: Headache: No Seizure: No Paresthesias: No  Current Medications:   Vital Signs  Mental Status Examination  Appearance: Alert: Yes Attention: good  Cooperative: Yes Eye Contact: Good Speech: Clear and coherent Psychomotor Activity: Normal Memory/Concentration: Normal/intact Oriented: person, place, time/date and situation Mood: Euthymic Affect: Appropriate and Congruent Thought Processes and Associations: Coherent and Intact Fund of Knowledge: Good Thought Content: WDL Insight: Good Judgement: Good  UDS:  PDMP:  Diagnosis:   Assessment:  Treatment Plan: Maryjean Morn, PA-C Patient ID: Jonathon Mendez, male   DOB: 11-28-1985, 36 y.o.   MRN: [id number]

## 2021-07-14 ENCOUNTER — Encounter (HOSPITAL_COMMUNITY): Payer: 59

## 2021-07-15 ENCOUNTER — Telehealth (HOSPITAL_COMMUNITY): Payer: Self-pay | Admitting: Licensed Clinical Social Worker

## 2021-07-15 NOTE — Telephone Encounter (Signed)
The therapist attempts to contact the client via phone due to his group NS yesterday. The therapist leaves a HIPAA-compliant voicemail with his callback number which is [phone].   [name], MA, LCSW, LCMHC, LCAS

## 2021-07-15 NOTE — Telephone Encounter (Signed)
The therapist receives a return call from Jonathon Mendez and completed identify verification via having him provide his full name, DOB, and address on file. He says that he missed group yesterday as she "woke up" in a "funk."   He has consistently rated his level of depression as a "0" on a 1-10 with 10 being most severe. The therapist administers the PHQ-9 to Jonathon Mendez today and he scores a "13" putting him in the moderately depressed range.   Jonathon Mendez indicates that he is fine with the therapist sharing this information with the [facility]. The therapist notes that the P.A. has considered starting Jonathon Mendez on Wellbutrin for his ADHD if his Jonathon Mendez does not work. The therapist suggests that if Wellbutrin were prescribed that it could potentially help with his ADHD and depression and even help for him to reduce the amount of cigarettes that he smokes.  Per Jonathon Mendez's report, he is experiencing a decrease of interest in doing things and decrease pleasure, feeling down and depressed, sleeping excessively, poor appetite, some feelings of lack of self-worth, and psychomotor retardation per his fiance's report.   He says that he will attend group on 07/18/21 and the therapist will share this information with the [facility] P.A.   Jonathon Blazer, MA, LCSW, LCMHC, LCAS

## 2021-07-18 ENCOUNTER — Other Ambulatory Visit (HOSPITAL_COMMUNITY): Payer: Self-pay | Admitting: Medical

## 2021-07-18 ENCOUNTER — Other Ambulatory Visit: Payer: Self-pay

## 2021-07-18 ENCOUNTER — Other Ambulatory Visit (HOSPITAL_COMMUNITY): Payer: 59 | Admitting: Licensed Clinical Social Worker

## 2021-07-18 DIAGNOSIS — F1190 Opioid use, unspecified, uncomplicated: Secondary | ICD-10-CM | POA: Diagnosis not present

## 2021-07-18 DIAGNOSIS — F1520 Other stimulant dependence, uncomplicated: Secondary | ICD-10-CM

## 2021-07-18 MED ORDER — BUPROPION HCL ER (SR) 200 MG PO TB12
200.0000 mg | ORAL_TABLET | Freq: Two times a day (BID) | ORAL | 2 refills | Status: AC
Start: 2021-07-18 — End: 2022-07-18

## 2021-07-18 NOTE — Progress Notes (Signed)
Patient ID: Jonathon Mendez, male   DOB: 1986-05-12, 36 y.o.   MRN: [id number] Pt reported to Counselo he missed group due to anegy/fatigue. Counselor rescreened PHQ 9 Score was 13 down from 24 on admission and rated as "Somewhat difficult. Suggestion was madwe to try Wellbutrin t rather than Straterra for his ADHD as well as moderate depression on screening. Pt agreed. He has not picked up [employer]. Will cancel and send Wellbutrin rx

## 2021-07-18 NOTE — Progress Notes (Signed)
Daily Group Progress Note  Program: [facility] IOP   Group Time: 9 a.m. to 10:30 a.m.   Type of Therapy: Process  Topic:  The therapist checked in with the group member to assess for SI/HI/psychosis and overall level of functioning. The therapist asks about sobriety date and number of community support meetings attended since last session. The therapist discusses ways that group members can overcome times in which they lack motivation to do things via educating them about the [place] Principle explaining how it is best for an unpleasurable activity to be followed by a pleasurable activity. The therapist suggests that group members need to come up with a list of non-addictive, healthy rewards to use in such situations. Therapist encourages continued AA attendance.   Group Time: 10:30 a.m. to 12 p.m.     Type of Therapy: Psychoeducational   Topic: The therapist briefly reviews last week's topic regarding Post-acute Withdrawal Syndrome and has members attend a portion of a Twelve Step Meeting virtually as one of the member's has still not attended a meeting. The therapist covers the Five Early Challenges to Recovery as well as new approaches for addressing these challenges. The therapist covers Alcohol Challenges explaining why group members are encouraged to discontinue alcohol even if they do not believe it to be a problem and have come to group to stop Cocaine or Methylamphetamine. The therapist shares research showing that persons in recovery who stop smoking cigarettes via medication assisted treatment have a higher likelihood of long-term sobriety than do that of individuals in recovery who continue to smoke. The therapist explains how continues use of substances such as alcohol and tobacco impede recovery from PAWS and how alcohol's disinhibiting effect puts individuals at greater risk of relapse for other substances. The therapist explains common signs of having an alcohol problem such as struggling to  not drink, a history of blackouts, a history of alcohol-related charges and convictions, etc.   Summary: Alexandr presents for group rating his depression as a 2 and his anxiety as a 3. He denies any suicidal or homicidal ideation and reports getting about 6 hours of sleep last night. He says that he was going to get a new, miniature gas car to take the hobby up again; however, his fianc did not allow him to do this as it was too expensive. He says that he got a new, X-box console instead and spent Saturday and Sunday gaming. He talks about the fact that he did not attend group last Thursday due to a lack of motivation which his fianc tells him is rolling over and giving up. When it is suggested that these bouts of a depression and lack of motivation could be PAWS-related, Hughie says that it likely is not as he had these same episodes when he was not using for 2-3 years. He does say that through his job at [employer] that he can join a gym for free so is looking into doing this with the therapist reinforcing this course of action. In discussing five early challenges to recovery, Leeshawn talks about when he used to use drugs in the home and the fact that his fianc told him that he could not which caused him some feelings of anger as he was working and paying bills. The therapist questions Teigen regarding possible reasons that his fianc could be justified in setting this limit as Calyn's use could impact her. Child can recognize that his use impacted his behavior which in turn impacted his fianc. He  is also able to recognize that his having illegal substances in the home put his fianc at risk of legal consequences even though she was not using. 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 When the therapist shows a list of in-person, NA meetings in [place]; he says that the one on [address] is an option as it is walking distance from where he lives. Ricquan says that he could work on trying to identify some rewards to give himself for motivation to be active when he has one of his bouts of depression. He knows that he is isolating socially; however, for now, says that this is the best course of action for him in order to avoid drug-using triggers though he seems receptive to possibly eventually joining a gym and attending a NA meeting at the [employer] location.   Progress Towards Goals: Raden reports that his sobriety date of 07/03/21 remains unchanged which will hopefully be confirmed via today's UDS. He shares more openly today about obstacles to recovery such as his occasional bouts of depression with a lack of motivation. The P.A. from IOP briefly checks in with group with Legrand Como indicating a willingness to take Wellbutrin so is advised to not pick up his Strattera as he has not already done so.   UDS collected: Yes  Results: No  AA/NA attended?: Attended a portion of a meeting today in group virtually.  Sponsor?: No   Adam Phenix, MA, LCSW, [place], [place]

## 2021-07-20 ENCOUNTER — Telehealth (HOSPITAL_COMMUNITY): Payer: Self-pay | Admitting: Licensed Clinical Social Worker

## 2021-07-20 ENCOUNTER — Encounter (HOSPITAL_COMMUNITY): Payer: 59

## 2021-07-20 NOTE — Telephone Encounter (Signed)
Therapist receives a call from Jonathon Mendez saying that he woke up with a horrible toothache and is on his way to the emergency dentist; however, he says that if he gets out early enough that he still plans on coming to group. He also adds that he can bring a note from the dentist if needed.  ? ?[name], MA, LCSW, LCMHC, LCAS ?

## 2021-07-21 ENCOUNTER — Encounter (HOSPITAL_COMMUNITY): Payer: 59

## 2021-07-22 ENCOUNTER — Telehealth (HOSPITAL_COMMUNITY): Payer: Self-pay | Admitting: Licensed Clinical Social Worker

## 2021-07-22 ENCOUNTER — Encounter (HOSPITAL_COMMUNITY): Payer: 59 | Admitting: Licensed Clinical Social Worker

## 2021-07-22 NOTE — Telephone Encounter (Signed)
Therapist calls Jonathon Mendez and verifies his identify with three identifiers. The member says that he had two teeth pulled this morning but can still come to group today. The therapist talks to Jonathon Mendez about his positive UDS for methylamphetamine from 07/18/21. He denies having any recall from using; however, says that his fiance has told him that he could disappear for a couple of hours and use apparently without being able to remember doing so. He says that he cannot argue with the results if they indicated that he did use. The therapist notes that if Jonathon Mendez is having periods of up to hours for which he has no recall of his actions that this would need to be evaluated as it would not be normative for a person to have blocks of lost time. ? ?He says that he will talk to his fiance to see if he did go out somewhere for a couple of hours for which he has no recall. ? ?[name], MA, LCSW, LCMHC, LCAS ?

## 2021-07-22 NOTE — Telephone Encounter (Signed)
Client calls leaving a voicemail about having two teeth pulled this morning as that the therapist calls him back. ? ?Jonathon Phenix, MA, LCSW, [place], LCAS ?

## 2021-07-25 ENCOUNTER — Telehealth (HOSPITAL_COMMUNITY): Payer: Self-pay | Admitting: Licensed Clinical Social Worker

## 2021-07-25 NOTE — Telephone Encounter (Signed)
The therapist returns Ching's call leaving a HIPAA-compliant voicemail.  ? ?Adam Phenix, MA, LCSW, [place], LCAS ?

## 2021-07-25 NOTE — Telephone Encounter (Signed)
Jonathon Mendez calls leaving a voicemail saying that his fiance has a family member who is missing so he will not be in group today; however, he may be in group on 07/27/21.  ? ?Jonathon Phenix, MA, LCSW, [place], LCAS ?

## 2021-07-27 ENCOUNTER — Telehealth (HOSPITAL_COMMUNITY): Payer: Self-pay | Admitting: Licensed Clinical Social Worker

## 2021-07-27 ENCOUNTER — Encounter (HOSPITAL_COMMUNITY): Payer: 59

## 2021-07-27 NOTE — Telephone Encounter (Signed)
The therapist staffs Chet's case today with Mr. Maryjean Morn, P.A. who locates a Release of Information for Farhaan's fiance, Ms. Paschal Dopp, recommending that the therapist outreach her in response to Brandn's lack of attendance to [facility]. ? ?The therapist attempts to reach her leaving a HIPAA-compliant voicemail with his direct call back number which is [phone]. ? ?[name], MA, LCSW, LCMHC, LCAS ?

## 2021-07-28 ENCOUNTER — Telehealth (HOSPITAL_COMMUNITY): Payer: Self-pay | Admitting: Licensed Clinical Social Worker

## 2021-07-28 NOTE — Telephone Encounter (Signed)
The therapist receives a return call from Jonathon Mendez's fiance and verifies her identity via three identifiers. When the therapist makes her aware of Jonathon Mendez's lack of attendance to group, she admits to being shocked saying that to her knowledge that he was attending all of the groups. She does say that it was true that she had a family member missing; however, knows nothing about Jonathon Mendez having had teeth extracted.  ? ?She says that he is on a leave of absence from work having reportedly given paperwork to [name], who used to be the therapist in this program, to fax; however, this therapist cannot locate any leave of absence paperwork in Epic. ? ?She is agreeable to attending a session with him and says that she will know by day's end if she can perhaps meet tomorrow between 8 and 9 a.m. or around noon. She has this therapist's direct contact number and is additionally given his email which is [name]  ? ?[name], MA, LCSW, LCMHC, LCAS ?

## 2021-07-28 NOTE — Telephone Encounter (Signed)
The therapist receives a return call from Mansoor's fiance verifying her identify via three identifiers.  ? ?The therapist explains to his fiance about Kree's lack of attendance. She expresses surprise in hearing this saying that she was under the impression that he was attending all groups. She confirms that she did have a family member missing recently; however, knows nothing about Munachimso's having two teeth pulled. Additionally, she does verify that he could not afford a residential treatment facility ? ?He is on a leave of absence from work. She believes that the previous therapist, Ms. Christianne Borrow, faxed information to his employer along with the leave paperwork; however, this therapist can find no record of this paperwork in Epic. ? ?She says that she could possibly attend a conjoint session with [name] tomorrow from 8 to 9 a.m. and will know by day's end. She has this therapist's direct contact number and is given his email address as well. ? ?[name], MA, LCSW, LCMHC, LCAS ?

## 2021-07-29 ENCOUNTER — Other Ambulatory Visit: Payer: Self-pay

## 2021-07-29 ENCOUNTER — Telehealth (HOSPITAL_COMMUNITY): Payer: Self-pay | Admitting: Licensed Clinical Social Worker

## 2021-07-29 ENCOUNTER — Other Ambulatory Visit (HOSPITAL_COMMUNITY): Payer: 59 | Attending: Psychiatry | Admitting: Licensed Clinical Social Worker

## 2021-07-29 DIAGNOSIS — Z8659 Personal history of other mental and behavioral disorders: Secondary | ICD-10-CM | POA: Diagnosis not present

## 2021-07-29 DIAGNOSIS — F17210 Nicotine dependence, cigarettes, uncomplicated: Secondary | ICD-10-CM | POA: Insufficient documentation

## 2021-07-29 DIAGNOSIS — F1010 Alcohol abuse, uncomplicated: Secondary | ICD-10-CM | POA: Insufficient documentation

## 2021-07-29 DIAGNOSIS — F1994 Other psychoactive substance use, unspecified with psychoactive substance-induced mood disorder: Secondary | ICD-10-CM | POA: Insufficient documentation

## 2021-07-29 DIAGNOSIS — F1220 Cannabis dependence, uncomplicated: Secondary | ICD-10-CM | POA: Insufficient documentation

## 2021-07-29 DIAGNOSIS — Z6372 Alcoholism and drug addiction in family: Secondary | ICD-10-CM | POA: Diagnosis not present

## 2021-07-29 DIAGNOSIS — F1520 Other stimulant dependence, uncomplicated: Secondary | ICD-10-CM | POA: Diagnosis not present

## 2021-07-29 NOTE — Progress Notes (Signed)
Daily Group Progress Note ? ?Program: [facility] IOP ? ? ?Group Time: 9 a.m. to 10:30 a.m. ? ?Type of Therapy: Process ? ?Topic: The therapist checked in with group members, assessed for SI/HI/psychosis and overall level of functioning. The therapist inquired about sobriety date and number of community support meetings attended since last session. The therapist and group members processed events that went well and any challenges to recovery. The therapist facilitated group processing primarily around post-acute withdrawal and how a lack of dopamine in this period of recovery can make it difficult for people in recovery to enjoy things and ways to get through this difficult period. The therapist explains why some people need residential treatment to get a certain level of brain recovery prior to attending OP treatment.   ? ? ?Group Time: 10:30 a.m. to 12 noon ? ?Type of Therapy: Psychoeducation and Process ? ?Topic: The therapist has the group members review the video, ?Addiction Requires Dishonesty? eliciting group members' feedback about how this video relates to their lives. The therapist uses a narrative therapy approach in discussing how their disease works to isolate them from other people and to not open up about challenging emotions. The therapist suggests that when they are having relationship issues with people in their lives that one option is to try and address this with this person directly. When group members are unable to think of any other options, the therapist illustrates for them how sharing about painful emotions and life events in Twelve Step meetings can help them to cope without having to use.  ? ? ?Summary: Jonathon Mendez returns to group rating both his depression and anxiety as a ?0.? He denies any SI/HI. He reports a new sobriety date of 07/23/21 and says that he has attended two on-line meetings. When the new group member shares about being a ?chronic relapser,? Jonathon Mendez asks, ?Are we brothers??  He attributes his most recent relapse due to his fianc? having given him ?more freedom.? He says that he was out at a store and ran into a person with whom he used to use who asked him to go to the money machine and then he started using. He admits to having lied about having two teeth pulled and notes that he was initially blaming the therapist for his fianc? being angry at him; however, recognizes that it is really due to his own behavior. The therapist questions the reason that Jonathon Mendez is coming to [facility]athon Mendez IOP with Jonathon Mendez saying that he is not just coming to get his fianc? off his back but because it is not fun anymore. In response to the new group member being upset that about his partner sharing information with his sister, Jonathon Mendez says that his fianc? has gone to his family when they are having problems which made him very angry. He calls his fianc? during group today as she is interested in doing a conjoint session with Jonathon Mendez and this therapist. They are scheduled to come in on 08/03/21 at 3 p.m. After reviewing the video, the therapist notes that Jonathon Mendez may be trying to keep the therapist from knowing what is going on with him as his addiction perceives the therapist as a threat. The therapist notes that Jonathon Mendez's struggle is not with the therapist or anyone on the outside but is an internal struggle with his disease.  ? ?Progress Towards Goals: Thus far, Jonathon Mendez's progress has been poor. He passed his first drug screen when he first started [facility]; however, has had positive screens  for Meth every times since. He has been lying to his fianc? in making her believe that he was attending group when he was not. The therapist hopes to address these issues more in depth in Jonathon Mendez's meeting with his fianc?.  ? ?UDS collected: Yes Results: Positive for methylamphetamine from last week ? ?AA/NA attended?: Yes ? Sponsor?: Yes ? ? ?Jonathon Phenix, MA, LCSW, [place], LCAS ?

## 2021-07-29 NOTE — Telephone Encounter (Signed)
The therapist receives the following email from Jonathon Mendez's fiance prior to group but does not see it as his arrives in his Spam folder: ? ?"[email or web address]> ?[email or web address], Chrissie Noa ?Fri 07/29/2021 8:39 AM ?*Caution - External email - see footer for warnings* ? ?Hi Bill! ? ?This is Sheena. I spoke with you yesterday morning. I hope this meets you well this morning! If you are available, can you talk with [name] individually after you guys? recovery session/class and I will then be able to meet with you and Kenon today together afterwards at 2:00? ? ?Thank You" ? ?The therapist schedules them for a meeting next week as this office closes today at 1 p.m. ? ?[name], MA, LCSW, LCMHC, LCAS ?

## 2021-08-01 ENCOUNTER — Other Ambulatory Visit: Payer: Self-pay

## 2021-08-01 ENCOUNTER — Other Ambulatory Visit (HOSPITAL_COMMUNITY): Payer: 59 | Admitting: Licensed Clinical Social Worker

## 2021-08-01 DIAGNOSIS — F1520 Other stimulant dependence, uncomplicated: Secondary | ICD-10-CM

## 2021-08-01 NOTE — Progress Notes (Signed)
Daily Group Progress Note ? ?Program: [facility] ? ? ?Group Time: 9 a.m. to 10:30 a.m. ? ?Type of Therapy: Process ? ?Topic: The therapist checked in with group members, assessed for SI/HI/psychosis and overall level of functioning. The therapist inquired about sobriety date and number of community support meetings attended since last session. The therapist and group members processed events that went well and any challenges to recovery. The therapist facilitated group processing primarily around viewing relapses as an opportunity for learning vs. a failure. The therapist points out that when people relapse and beat up on themselves emotionally that this is counterproductive as they are not working on what they will do differently the next time when facing this trigger or how to avoid this same trigger in the future. The therapist also talks about traits or qualities members want to look for in regards to choosing a Sponsor.  ? ? ?Group Time: 10:30 a.m. to 12 noon ? ?Type of Therapy: Psychoeducation and Process ? ?Topic: The therapist has the group members complete and discuss the Make it Your Own Strategies that Help People with Recovery checklist identifying strategies that they are already using as well as strategies that they would like to try or develop further.  ? ? ?Summary: Jonathon Mendez presents today rating his depression and anxiety as a 0 and denying any SI or HI. He reports feeling proud of himself for not going and ?buying dope? as he knew that his UDS from Friday would be negative so his disease was telling him that he thus could get away with using over the weekend. He says that if he were to run into one of his former using associates in the store as he did recently that he would not go use with him but tell him to ?f__ off.? He says that he is feeling better but apprehensive as he is coming up on 3 weeks of sobriety noting that he has never made it past three weeks. He says that he was not taking his  psychotropic medications previously as he was using and asks about the prescriptions that the P.A. sent in recently questioning if he needs new ones. The therapist informs him that the prescriptions should still be waiting at his pharmacy. Jonathon Mendez talks having sleep problems recently and is reminded of his Trazodone. He says that the only problem with his Trazodone is that when he took it in the past that he felt groggy the next morning. The therapist discusses sleep hygiene and how people do best when going to bed at the same time and getting up at the same time daily. He suggests that rather than taking his Trazodone right at the time he wants to sleep that he try taking it an hour or two before bedtime such that he has less residual in his system the next day which could eliminate the next day grogginess. Jonathon Mendez has attended some meetings and was shocked that people reached out to him to share numbers. He says that he connected with one of these guys and admits that it feels strange to him. The therapist suggests that this new, healthy pattern will feel strange at first but eventually will become the norm. Jonathon Mendez questions how people get through the death of family members, etc. without getting drunk or high with the therapist suggesting that they reach out to their support system and talk about their feelings which is a new thing for Jonathon Mendez. Jonathon Mendez also realizes that now that his fianc? knows about his  use that it has lost more of its appeal as it was somehow more exciting when a secret. The therapist suggests that when people have a desire to use that not keeping it a secret and ?telling on? themselves by reaching out to a non-using support often causes the desire to use to dissipate. 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 He identifies wanting to develop a support system as a strategy he wants to do more of. He says that he did not like the therapist at first and was angry that he called his fianc?; however, says that he now likes him as calling her meant that he cares. At the end of group, he shares about his fears that he will stop using and then drop dead of some health problems such as another family member did. The therapist counters this irrational belief pointing out that not using reduces an array of health risks and chances of living long life. As Jonathon Mendez has not really had a physical since having a work physical, the therapist encourages him to get a physical and to make this an annual practice talking about the importance of preventative medicine. Jonathon Mendez shares more and is more engaged in this group than any previous group and concludes that he got a lot out of this meeting.  ? ? ?Progress Towards Goals: Jonathon Mendez makes progress today in sharing more in group and being honest about his desire to use after what he believes will be a negative UDS from last week; however, instead, interrupting this process such that he did not use. He also reports more meeting attendance and has received numbers from other members and spoke to at least one person from this meeting.  ? ?UDS collected: Yes Results: No ? ?AA/NA attended?: Yes ? ?Sponsor?: No ? ? ?Adam Phenix, MA, LCSW, [place], LCAS ?

## 2021-08-03 ENCOUNTER — Other Ambulatory Visit (HOSPITAL_COMMUNITY): Payer: 59 | Admitting: Licensed Clinical Social Worker

## 2021-08-03 ENCOUNTER — Other Ambulatory Visit: Payer: Self-pay

## 2021-08-03 ENCOUNTER — Encounter (HOSPITAL_COMMUNITY): Payer: Self-pay | Admitting: Medical

## 2021-08-03 DIAGNOSIS — Z8659 Personal history of other mental and behavioral disorders: Secondary | ICD-10-CM

## 2021-08-03 DIAGNOSIS — Z9189 Other specified personal risk factors, not elsewhere classified: Secondary | ICD-10-CM

## 2021-08-03 DIAGNOSIS — Z87898 Personal history of other specified conditions: Secondary | ICD-10-CM

## 2021-08-03 DIAGNOSIS — Z6372 Alcoholism and drug addiction in family: Secondary | ICD-10-CM

## 2021-08-03 DIAGNOSIS — F1994 Other psychoactive substance use, unspecified with psychoactive substance-induced mood disorder: Secondary | ICD-10-CM

## 2021-08-03 DIAGNOSIS — F1520 Other stimulant dependence, uncomplicated: Secondary | ICD-10-CM | POA: Diagnosis not present

## 2021-08-03 DIAGNOSIS — F1210 Cannabis abuse, uncomplicated: Secondary | ICD-10-CM

## 2021-08-03 DIAGNOSIS — F17210 Nicotine dependence, cigarettes, uncomplicated: Secondary | ICD-10-CM

## 2021-08-03 DIAGNOSIS — F1010 Alcohol abuse, uncomplicated: Secondary | ICD-10-CM

## 2021-08-03 NOTE — Progress Notes (Signed)
Daily Group Progress Note ? ?Program: [facility] ? ? ?Group Time: 9 a.m. to 12 p.m. ? ?Type of Therapy: Process ? ?Topic: The therapist checks in with group members, assesses for SI/HI/psychosis and overall level of functioning. The therapist inquires about sobriety date and number of community support meetings attended since last session. The therapist and group members process events that went well and any challenges to recovery. The therapist facilitates group processing primarily around the benefits of smoking cessation including information on how this impacts recovery from addiction, overall health, and how people metabolize medications. The therapist also facilitates group discussion around things that people do not believe that can do effectively if they stop using drugs and alcohol. ? ?Summary: Jonathon Mendez presents saying that he has attended three meetings. He talks about how people gave him numbers to call and offers to share them with another group member who is having difficulty getting up the courage to ask people directly about being his Sponsor. 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 He says that they then asked him to read something; however, he said that he was not ready for this yet. Jonathon Mendez says that he has been using the nicotine vape that one of the group members gave him at last group and asks if this is something problematic noting that his fianc? encouraged him to ask about this as well. He says that the smoke from the vape and sound the vape makes seems to mimic his experience when smoking Meth such that it satisfies his craving to use while not making him want to use. The therapist notes that nicotine vape is, per current research, not as harmful as smoking tobacco and discusses the concept of ?harm reduction.? The  therapist and group members suggest that using this vape is a form of harm reduction when compared to using Meth. After Jonathon Mendez meets with the P.A. today, he says that he too reinforced that Jonathon Mendez could continue to vape for now if it is preventing him from using Meth. Jonathon Mendez talks about how he used to smoke marijuana in high school. He says that his mother used to encourage him to smoke at home saying that she preferred him using at home than on the streets; however, she apparently expressed disapproval to the point that he finally quit. He says that he did not use Meth when on probation; however, once he got off of probation that he started using. He admits that he has been trying to use at home versus outside of the house so as to not get into legal trouble; however, this has negatively impacted his relationship with his fianc?. Regardless of his effort to manage his use, he says that he has been ?getting lucky? in not getting arrested again; however, knows that this luck would not continue. He says that he is at the 3 week point of recovery which is when he always relapses and is encouraged to focus on ?just for today? at this point versus looking too far ahead. He returns to work on the 18th. He says that he used to believe that his work improved due to using drugs; however, is now seeing that this is not the trust. He also says that his fianc? informed him that she hates having sex with him when he is using for a  variety of reasons. In discussing one of his family members who told Jonathon Mendez to ?just stop? as he did, Jonathon Mendez discovered that this family member was still engaging in an array of unhealthy activities which leads to a discussion on what is a ?dry drunk.? When one of the group members leaves to see the P.A., Jonathon Mendez states his opinion that this group member is not taking the IOP meetings as seriously as he and another group member are. It is suggested that he can share this feedback with the other  group member which he declines to do. The therapist reminds him that people coming to recovery and meetings will not always have the same amount of motivation or be in the same stage of change yet could hear something that might lead to a change in the right direction. Jonathon Mendez acknowledges that he had no motivation in past groups, yet this happened to him.  ? ?Progress Towards Goals: Jonathon Mendez is now sharing actively in meetings and opening up about his past efforts at lying so he could continue to use. He asks about his drug test which he took last Friday which has not comeback as of yet; however, should be negative for Meth if his self-report is accurate.  ? ?UDS collected: No Results: No ? ?AA/NA attended?: Yes ? ?Sponsor?: No ? ? ? ?Adam Phenix, MA, LCSW, [place], LCAS ?
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
## 2021-08-05 ENCOUNTER — Other Ambulatory Visit (HOSPITAL_COMMUNITY): Payer: 59 | Admitting: Licensed Clinical Social Worker

## 2021-08-05 ENCOUNTER — Other Ambulatory Visit: Payer: Self-pay

## 2021-08-05 DIAGNOSIS — F1520 Other stimulant dependence, uncomplicated: Secondary | ICD-10-CM | POA: Diagnosis not present

## 2021-08-05 NOTE — Progress Notes (Signed)
Daily Group Progress Note ? ?Program: [facility] ? ? ?Group Time: 9 a.m. to 10:30 a.m. ? ?Type of Therapy: Process ? ?Topic: The therapist checks in with group members, assesses for SI/HI/psychosis and overall level of functioning. The therapist inquires about sobriety date and number of community support meetings attended since last session. The therapist and group members process events that went well and any challenges to recovery. The therapist facilitates group processing primarily around the issue of enabling as well as how unresolved resentments can be hazardous to one's recovery. The therapist also responds to one group member's stated that he used to drink ?just beer? educating group members about the fact that one twelve-ounce beer has the same amount of alcohol and is just as harmful as one ounce of liquor. The therapist addresses the problem of cell phone usage such as texting when in group.  ? ? ?Group Time: 10:30 a.m. to 12 noon ? ?Type of Therapy: Psychoeducation and Process ? ?Topic: The therapist has the group members watch the video, ?Relapse Prevention Plan and Early Warning Signs? and additionally have the read the NA Just for Today Meditation for today relating to fear and discusses how this connects to the concept of emotional relapse which precedes mental relapse. The therapist elicits feedback from the group and relates this material to past and present circumstances in their lives. ? ? ?Summary: Emmitte presents for group rating his anxiety and depression as a ?0? with no suicidal or homicidal ideation. He notes that his fianc? ?made? him attend one meeting since he was last here. He says that he returns to work tomorrow and is excited about getting out of the house; however, his fianc? is worried about him returning to 3rd shift fearing that this will lead him back to his ?old ways.? He says that he will be working 12 hours shifts which are three on and four off. Spero's job leads to a  discussion regarding how people in recovery need to evaluate whether their work jeopardizes their recovery; and if so, possibly make a job change. When another group member asks if Arvil deleted or blocked his drug dealers' numbers, Tayvion responds, ?No, she (the fianc?) did that.? The concept of enabling is discussed in group today, and it is pointed out that people who enable have the intent of helping; however, inadvertently often end up doing the opposite. For example, it is suggested that by deleting these numbers for Lorrin, trying to make him attend meetings, etc. that his fianc? is robbing Mahlon of the opportunity to take full responsibly for managing his own recovery and taking away opportunities for learning. Bryker says that his fianc? was talking about leaving him but has not mentioned this since the conjoint meeting with this therapist. He says that when she tried to initiate this conversation in the past that he would do things to derail the conversation. He notes that his fianc? once pointed out that Emilian spent $1600 in a short time period on Meth. Nicklas says that he would get $15 here-and-there on credit from dealers and would be cutoff completely when he could not pay them back. He says that he never got so much on credit that they would want to retaliate with violence. Ruhaan says that he is tired today as she did not take his Trazodone last night as he stayed up too late to take it. This leads into a discussion of ways that White can assure that he takes his prescribed medications every  day as prescribed and how he can adjust when he takes the Trazodone when he returns to night shift. At one point towards the end of group, Lester looks a little drowsy and distracted and I seen looking down at his phone. When the therapist notes this, Khoi admits that he is playing a game on his phone with this leading to a discussion of phone usage during group by all members which has been a  problem in the past. Thus, the group members agree to a rule that members should only use their phones when on break and not during group.  ? ? ?Progress Towards Goals: Mich had his first negative drug screen for methamphetamine since the first drug test that he took when he started in [facility] which was his only one. He continues to share and be active in group. ? ?UDS collected: Yes Results: Yes; results from 07/29/21 are positive for methylamphetamine but negative on results from 08/01/21; however, the 08/01/21 results are positive for alcohol which is not surprising given that Legrand Como admitted to drinking a beer the weekend before his 08/01/21 UDS ? ?AA/NA attended?: Yes ? ?Sponsor?: No ? ? ? ?Adam Phenix, MA, LCSW, [place], LCAS ? ?

## 2021-08-08 ENCOUNTER — Other Ambulatory Visit: Payer: Self-pay

## 2021-08-08 ENCOUNTER — Other Ambulatory Visit (HOSPITAL_COMMUNITY): Payer: 59 | Admitting: Licensed Clinical Social Worker

## 2021-08-08 ENCOUNTER — Telehealth (HOSPITAL_COMMUNITY): Payer: Self-pay | Admitting: Licensed Clinical Social Worker

## 2021-08-08 DIAGNOSIS — F1520 Other stimulant dependence, uncomplicated: Secondary | ICD-10-CM | POA: Diagnosis not present

## 2021-08-08 NOTE — Telephone Encounter (Signed)
The therapist receives a call from Jonathon Mendez saying that Jonathon Mendez needs a note from the P.A. saying that Jonathon Mendez is cleared to return to work. The therapist consults with the P.A. who asks that the therapist have Jonathon Mendez confirm exactly to whom the letter it to be written and get a copy of the leave paperwork previously completed if possible. ? ?Jonathon Mendez says that Jonathon Mendez will get the name of the person to whom the letter is to be addressed and believes that Jonathon Mendez has an electronic version of his leave paperwork that Jonathon Mendez can email to the therapist. ? ?Jonathon Blazer, MA, LCSW, LCMHC, LCAS ?
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
## 2021-08-10 ENCOUNTER — Other Ambulatory Visit: Payer: Self-pay

## 2021-08-10 ENCOUNTER — Telehealth (HOSPITAL_COMMUNITY): Payer: Self-pay | Admitting: Licensed Clinical Social Worker

## 2021-08-10 ENCOUNTER — Other Ambulatory Visit (HOSPITAL_COMMUNITY): Payer: 59 | Admitting: Licensed Clinical Social Worker

## 2021-08-10 DIAGNOSIS — F1520 Other stimulant dependence, uncomplicated: Secondary | ICD-10-CM | POA: Diagnosis not present

## 2021-08-10 NOTE — Progress Notes (Signed)
Daily Group Progress Note ? ?Program: CD IOP ? ? ?Group Time: 9 a.m. to 11 a.m. ? ?Type of Therapy: Process ? ?Topic: The therapist checks in with group members, assesses for SI/HI/psychosis and overall level of functioning. The therapist inquires about sobriety date and number of community support meetings attended since last session. The therapist and group members process events that went well and any challenges to recovery. The therapist facilitates group processing primarily around the issue of how recovery can impact family dynamics and the reasons that family therapy is considered an essential part of substance use treatment.  ? ? ?Group Time: 11 a.m. to 12 noon ? ?Type of Therapy: Psychoeducation and Process ? ?Topic: The therapist begins the [facility] module on Irrational Beliefs pointing out how underlying beliefs about a stressor even cause the emotional consequences and not the stressor event itself. The therapist has group members complete the exercise on identifying an event that they found stressful and how they might have felt different if their attitudes and beliefs about the even were different than what they were. The therapist reads several irrational statements having the group members explain why the statement is irrational such as the statement, ?I ought to be able to have complete control over my emotions. I must be a weak person.? The therapist talks about the reason that it is important for group members to be able to identify how they are feeling so as to identify emotional relapse and thus take appropriate action before it becomes mental and then physical relapse.  ? ?Summary: Jonathon Mendez presents for group rating his depression and anxiety both as a ?5? which is the first time that he has not rated them both a ?0.? He notes that he has been having headaches lately questioning the therapist as to whether this might be related to not using. The therapist asks about Jonathon Mendez's health conditions.  As Jonathon Mendez has HTN, the therapist suggests that HTN can cause headaches in some people and that regardless that he needs to talk to his PCP if his blood pressure is remaining elevated as he may possibly need to be on medication. It is recommended that he start monitoring his blood pressure and he is encouraged to talk to the P.A. about his headaches as well. Jonathon Mendez is given a note to return to work saying that his supervisor sent him home Monday pressing the issue of a note which made him angry; however, he remained calm. He talks about problems that he is having with his fianc? who continues to suspect he is using and questioning changes in his behavior. He says that she has noticed that he now has more money now that he is not buying drugs and has started spending more money with Jonathon Mendez questioning her regarding why she is now spending so much money. He admits to having had some urges to use last night which were gone this morning. Jonathon Mendez, along with the other group member, says that he often cannot identify how he is feeling now that he is not using. The therapist notes that Jonathon Mendez seemed angry and hurt by his fianc?'s recent actions and angry at his boss pointing out how this began to lead to mental relapse which fortunately did not turn into physical relapse. When challenged regarding what he can do if upset at his fianc? and thinking of using, Jonathon Mendez is only able to come up with solitary solutions with it being pointed out by the therapist and other group member that he could  reach out to someone in the NA program. In discussing stressful events that could have had a different outcome with a different belief or attitude, Jonathon Mendez says that this group is an example. He says that his belief that this group would be just like the ones that he attended before and that everyone in the group would not really want to be working on recovery prevented him from engaging in group for a long time.  ? ?Progress Towards  Goals: Jonathon Mendez has attended six out of his last six groups and continues to report no use of methylamphetamine with results from his UDS from 08/08/21 still pending. He has returned to work. He still continues to not engage with NA and tends to spend time only with his fianc? or isolating.  ? ?UDS collected: No Results: No ? ?AA/NA attended?: Yes; attended one, partial meeting ? ?Sponsor?: No ? ? ? ?Adam Phenix, MA, LCSW, [place], LCAS ?

## 2021-08-10 NOTE — Telephone Encounter (Signed)
The therapist faxes a Behavioral Health Questionnaire to the Medical Request Team with [employer] so Rolfe can hopefully return to work. ? ?[name], MA, LCSW, [facility], LCAS ?

## 2021-08-12 ENCOUNTER — Encounter (HOSPITAL_COMMUNITY): Payer: 59 | Admitting: Licensed Clinical Social Worker

## 2021-08-15 ENCOUNTER — Telehealth (HOSPITAL_COMMUNITY): Payer: Self-pay | Admitting: Licensed Clinical Social Worker

## 2021-08-15 ENCOUNTER — Other Ambulatory Visit (HOSPITAL_COMMUNITY): Payer: 59 | Admitting: Licensed Clinical Social Worker

## 2021-08-15 ENCOUNTER — Other Ambulatory Visit: Payer: Self-pay

## 2021-08-15 DIAGNOSIS — F1520 Other stimulant dependence, uncomplicated: Secondary | ICD-10-CM

## 2021-08-15 NOTE — Telephone Encounter (Signed)
The therapist receives an email today which he believes might be from [name]; however, the email comes with the name, "[name]" and the following address; [email or web address]. ? ?The therapist verifies Jonathon Mendez's identify with three identifiers and he confirms that he sent the email asking for a copy of the paperwork that the therapist faxed to his insurance company. ? ?The therapist says that if the information has not been sent out to be scanned that he can make a copy today to give him at group on Wednesday; however, it is has been sent out; then the therapist will have to wait for it to show up under the media tab in Epic before  he can print a copy for [name]. ? ?Sunday, MA, LCSW, [facility], LCAS ?08/15/2021 ?
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
## 2021-08-15 NOTE — Telephone Encounter (Signed)
The therapist sends a fax to [employer] in response from a request for office visit notes from Mach 24, 2023.  ? ?[name], MA, LCSW, [facility], LCAS ?08/15/2021 ?

## 2021-08-15 NOTE — Telephone Encounter (Signed)
The therapist calls "Ani" with [employer] in response to her faxed request for copies of office visit notes from today. ? ?The therapist explains that he sent a fax earlier today indicating that the client showed for his appointment; however, that the therapist cannot send a psychotherapy note from today as it is excluded based on the Release of Information that he signed.  ? ?When asked, the therapist reads this specific section from the Release of Information which is [employer]'s own Release of Information form. ? ?Ani says that she cannot continue his claim without the note; however, the therapist explains that the client returned to work over a week ago. ? ?Thus, she says that she will close the claim and get his return to work date from his employer.  ? ?[name], MA, LCSW, [facility], LCAS ?08/15/2021 ?

## 2021-08-15 NOTE — Telephone Encounter (Signed)
The therapist is informed by the front desk that Eschol arrived for group around 10:30 a.m. saying that he would call this therapist. ? ?When the therapist goes to locate him in the waiting room, he is not there. The therapist receives a voicemail informing the therapist that he is in the parking lot and will wait 5 minutes or so. ? ?As the therapist is calling Quintell, he receives an incoming call from [name] with the therapist explaining that he locked the group room after waiting about 30 minutes for members to show. He informs Purnell that he can see him so Danielle says, "I will turn around." ? ?[name], MA, LCSW, [facility], LCAS ?08/15/2021 ?

## 2021-08-16 ENCOUNTER — Telehealth (HOSPITAL_COMMUNITY): Payer: Self-pay | Admitting: Licensed Clinical Social Worker

## 2021-08-16 NOTE — Telephone Encounter (Signed)
The therapist receives a call from [place] verifying her identity via three identifiers. She asks if Mathieu passed his UDS from 08/08/21 with the therapist informing her that he did. ? ?She says that she is asking as they do not usually start arguing unless he is using Meth. She also says that he was recently accusing her of seeing someone else.  ? ?The therapist says that hopefully when we meet tomorrow at 4 p.m. that we can figure out what is causing this arguing and more importantly come up with a plan to resolve this issue or issues. ? ?Adam Phenix, MA, LCSW, [facility], LCAS ?08/16/2021 ?

## 2021-08-17 ENCOUNTER — Encounter (HOSPITAL_COMMUNITY): Payer: 59

## 2021-08-17 ENCOUNTER — Telehealth (HOSPITAL_COMMUNITY): Payer: Self-pay | Admitting: Licensed Clinical Social Worker

## 2021-08-17 NOTE — Telephone Encounter (Signed)
The therapist receives a telephone call from Jonathon Mendez's fiance saying that they will not be able to make their appointment today as she just got off of work. ? ?The therapist asks to speak with Jonathon Mendez verifying his identify via three identifiers. The therapist questions the reason that Jonathon Mendez was not in group today. He says that he got off of work late and just did not feel like coming.  ? ?He says that he will be in group on Friday and is reminded to call and let the therapist know if he will be late or not attending.  ? ?The therapist staffed Jonathon Mendez's case today with the P.A., Mr. Maryjean Morn; who suggested that a HLOC may need to be considered. ? ?[name], MA, LCSW, [facility], LCAS ?08/17/2021 ?

## 2021-08-19 ENCOUNTER — Telehealth (HOSPITAL_COMMUNITY): Payer: Self-pay | Admitting: Licensed Clinical Social Worker

## 2021-08-19 ENCOUNTER — Encounter (HOSPITAL_COMMUNITY): Payer: 59

## 2021-08-19 NOTE — Telephone Encounter (Signed)
The therapist leaves a HIPAA-compliant voicemail letting Devonaire that he is not to return to group until he has spoken individually with this therapist with the therapist leaving his direct callback number which is [phone]. ? ?Adam Phenix, MA, LCSW, [facility], LCAS ?08/19/2021 ?

## 2021-08-22 ENCOUNTER — Encounter (HOSPITAL_COMMUNITY): Payer: Self-pay | Admitting: Medical

## 2021-08-22 ENCOUNTER — Ambulatory Visit (HOSPITAL_BASED_OUTPATIENT_CLINIC_OR_DEPARTMENT_OTHER): Payer: 59 | Admitting: Medical

## 2021-08-22 DIAGNOSIS — F1210 Cannabis abuse, uncomplicated: Secondary | ICD-10-CM

## 2021-08-22 DIAGNOSIS — Z91199 Patient's noncompliance with other medical treatment and regimen due to unspecified reason: Secondary | ICD-10-CM

## 2021-08-22 DIAGNOSIS — Z6372 Alcoholism and drug addiction in family: Secondary | ICD-10-CM

## 2021-08-22 DIAGNOSIS — Z8659 Personal history of other mental and behavioral disorders: Secondary | ICD-10-CM

## 2021-08-22 DIAGNOSIS — F1010 Alcohol abuse, uncomplicated: Secondary | ICD-10-CM

## 2021-08-22 DIAGNOSIS — F1520 Other stimulant dependence, uncomplicated: Secondary | ICD-10-CM

## 2021-08-22 DIAGNOSIS — F17210 Nicotine dependence, cigarettes, uncomplicated: Secondary | ICD-10-CM

## 2021-08-22 DIAGNOSIS — F1994 Other psychoactive substance use, unspecified with psychoactive substance-induced mood disorder: Secondary | ICD-10-CM

## 2021-08-22 DIAGNOSIS — Z87898 Personal history of other specified conditions: Secondary | ICD-10-CM

## 2021-08-22 DIAGNOSIS — Z9189 Other specified personal risk factors, not elsewhere classified: Secondary | ICD-10-CM

## 2021-08-22 NOTE — Addendum Note (Signed)
Addended by: Court Joy on: 08/22/2021 04:08 PM ? ? Modules accepted: Level of Service ? ?
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
## 2022-01-23 ENCOUNTER — Other Ambulatory Visit: Payer: Self-pay

## 2022-01-23 ENCOUNTER — Emergency Department (HOSPITAL_BASED_OUTPATIENT_CLINIC_OR_DEPARTMENT_OTHER)
Admission: EM | Admit: 2022-01-23 | Discharge: 2022-01-23 | Disposition: A | Discharge status: Home / Self Care | Payer: Self-pay | Attending: Emergency Medicine | Admitting: Emergency Medicine

## 2022-01-23 DIAGNOSIS — F17200 Nicotine dependence, unspecified, uncomplicated: Secondary | ICD-10-CM | POA: Insufficient documentation

## 2022-01-23 DIAGNOSIS — U071 COVID-19: Secondary | ICD-10-CM | POA: Insufficient documentation

## 2022-01-23 DIAGNOSIS — Z20822 Contact with and (suspected) exposure to covid-19: Secondary | ICD-10-CM | POA: Insufficient documentation

## 2022-01-23 LAB — SARS CORONAVIRUS 2 BY RT PCR: SARS Coronavirus 2 by RT PCR: POSITIVE — AB

## 2022-01-23 NOTE — Discharge Instructions (Signed)
You were diagnosed today with COVID-19.  Please take over-the-counter medication for fever and pain control.  Work return note provided.  If you develop life-threatening condition such as severe shortness of breath please return to the emergency department

## 2022-01-23 NOTE — ED Triage Notes (Signed)
POV, pt sts that approx 1 day ago he began having headache, body aches, congestion.  Alert and oriented x 4, ambulatory to triage.

## 2022-01-23 NOTE — ED Provider Notes (Signed)
[facility] EMERGENCY DEPT Provider Note   CSN: [id number] Arrival date & time: 01/23/22  1956     History  Chief Complaint  Patient presents with   URI    Jonathon Mendez is a 36 y.o. male.  Patient presents to the hospital complaining of 1 day of headache, body aches, and congestion.  He denies nausea, vomiting, abdominal pain, shortness of breath, cough at this time.  Patient denies known fevers.  He denies any relevant past medical history.  Patient is a daily tobacco user  HPI     Home Medications Prior to Admission medications   Medication Sig Start Date End Date Taking? Authorizing Provider  baclofen (LIORESAL) 10 MG tablet Take 1 tablet (10 mg total) by mouth 3 (three) times daily. 06/13/21 06/13/22  Court Joy, PA-C  buPROPion (WELLBUTRIN SR) 200 MG 12 hr tablet Take 1 tablet (200 mg total) by mouth 2 (two) times daily. 07/18/21 07/18/22  Court Joy, PA-C  traZODone (DESYREL) 50 MG tablet Take 1 tablet (50 mg total) by mouth at bedtime and may repeat dose one time if needed. 06/13/21   Court Joy, PA-C      Allergies    Patient has no known allergies.    Review of Systems   Review of Systems  Constitutional:  Negative for fever.  HENT:  Positive for congestion.   Respiratory:  Negative for cough and shortness of breath.   Musculoskeletal:  Positive for myalgias.  Neurological:  Positive for headaches.    Physical Exam Updated Vital Signs BP (!) 147/98 (BP Location: Right Arm)   Pulse 74   Temp 98.1 F (36.7 C)   Resp 18   Ht 5\' 9"  (1.753 m)   Wt 90.7 kg   SpO2 100%   BMI 29.53 kg/m  Physical Exam Vitals and nursing note reviewed.  Constitutional:      General: He is not in acute distress.    Appearance: He is well-developed.  HENT:     Head: Normocephalic and atraumatic.  Eyes:     Conjunctiva/sclera: Conjunctivae normal.  Cardiovascular:     Rate and Rhythm: Normal rate.     Heart sounds: No murmur  heard. Pulmonary:     Effort: Pulmonary effort is normal. No respiratory distress.  Abdominal:     General: There is no distension.  Musculoskeletal:        General: No swelling.     Cervical back: Neck supple.  Skin:    General: Skin is warm and dry.     Capillary Refill: Capillary refill takes less than 2 seconds.  Neurological:     Mental Status: He is alert and oriented to person, place, and time.  Psychiatric:        Mood and Affect: Mood normal.     ED Results / Procedures / Treatments   Labs (all labs ordered are listed, but only abnormal results are displayed) Labs Reviewed  SARS CORONAVIRUS 2 BY RT PCR - Abnormal; Notable for the following components:      Result Value   SARS Coronavirus 2 by RT PCR POSITIVE (*)    All other components within normal limits    EKG None  Radiology No results found.  Procedures Procedures    Medications Ordered in ED Medications - No data to display  ED Course/ Medical Decision Making/ A&P  Medical Decision Making  Patient presents with chief complaint of body aches, congestion, headache.  Differential includes but is not limited to COVID-19, influenza, other viral illnesses, and others  I reviewed the patient's past medical history and found notes related to methamphetamine use disorder but no relevant recent medical issues.  I ordered lab work.  Pertinent results include positive COVID-19 test.  I discussed antiviral medication with the patient the patient states he is not interested in them at this time.  This is perfectly reasonable.  He has no serious comorbidities.  Patient will discharge home.  Patient requested a work note which was provided.  Patient may use supportive care at home.  Return precautions provided        Final Clinical Impression(s) / ED Diagnoses Final diagnoses:  COVID-19    Rx / DC Orders ED Discharge Orders     None         Pamala Duffel 01/23/22 2203    Virgina Norfolk, DO 01/23/22 2251

## 2022-03-18 IMAGING — CR DG CHEST 2V
2 series · 2 of 2 positions shown · non-contrast
Comparison: 08/21/2019

CLINICAL DATA: Cough and nasal congestion today. Negative COVID
test.

EXAM:
CHEST - 2 VIEW

[w chest pa]
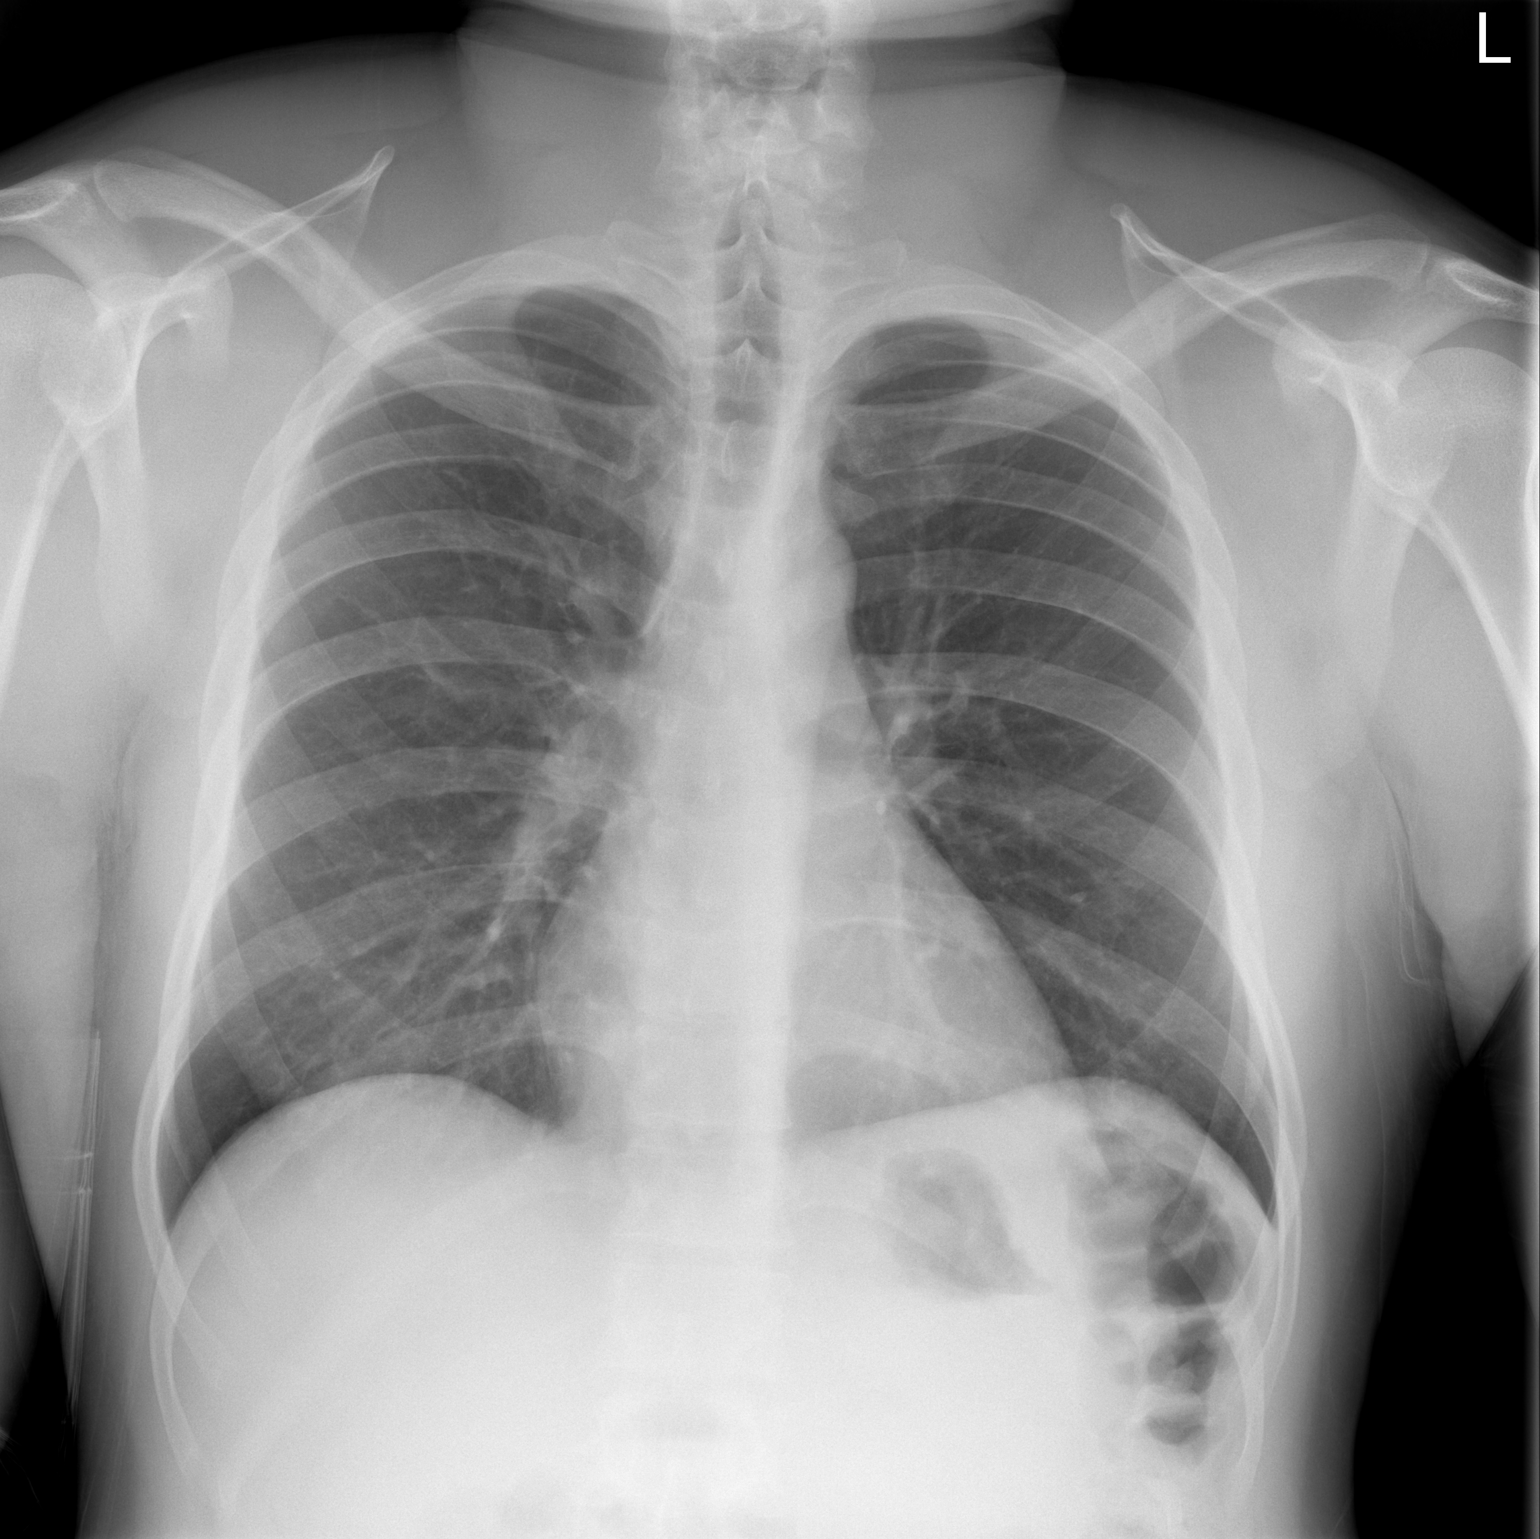

[w chest lat]
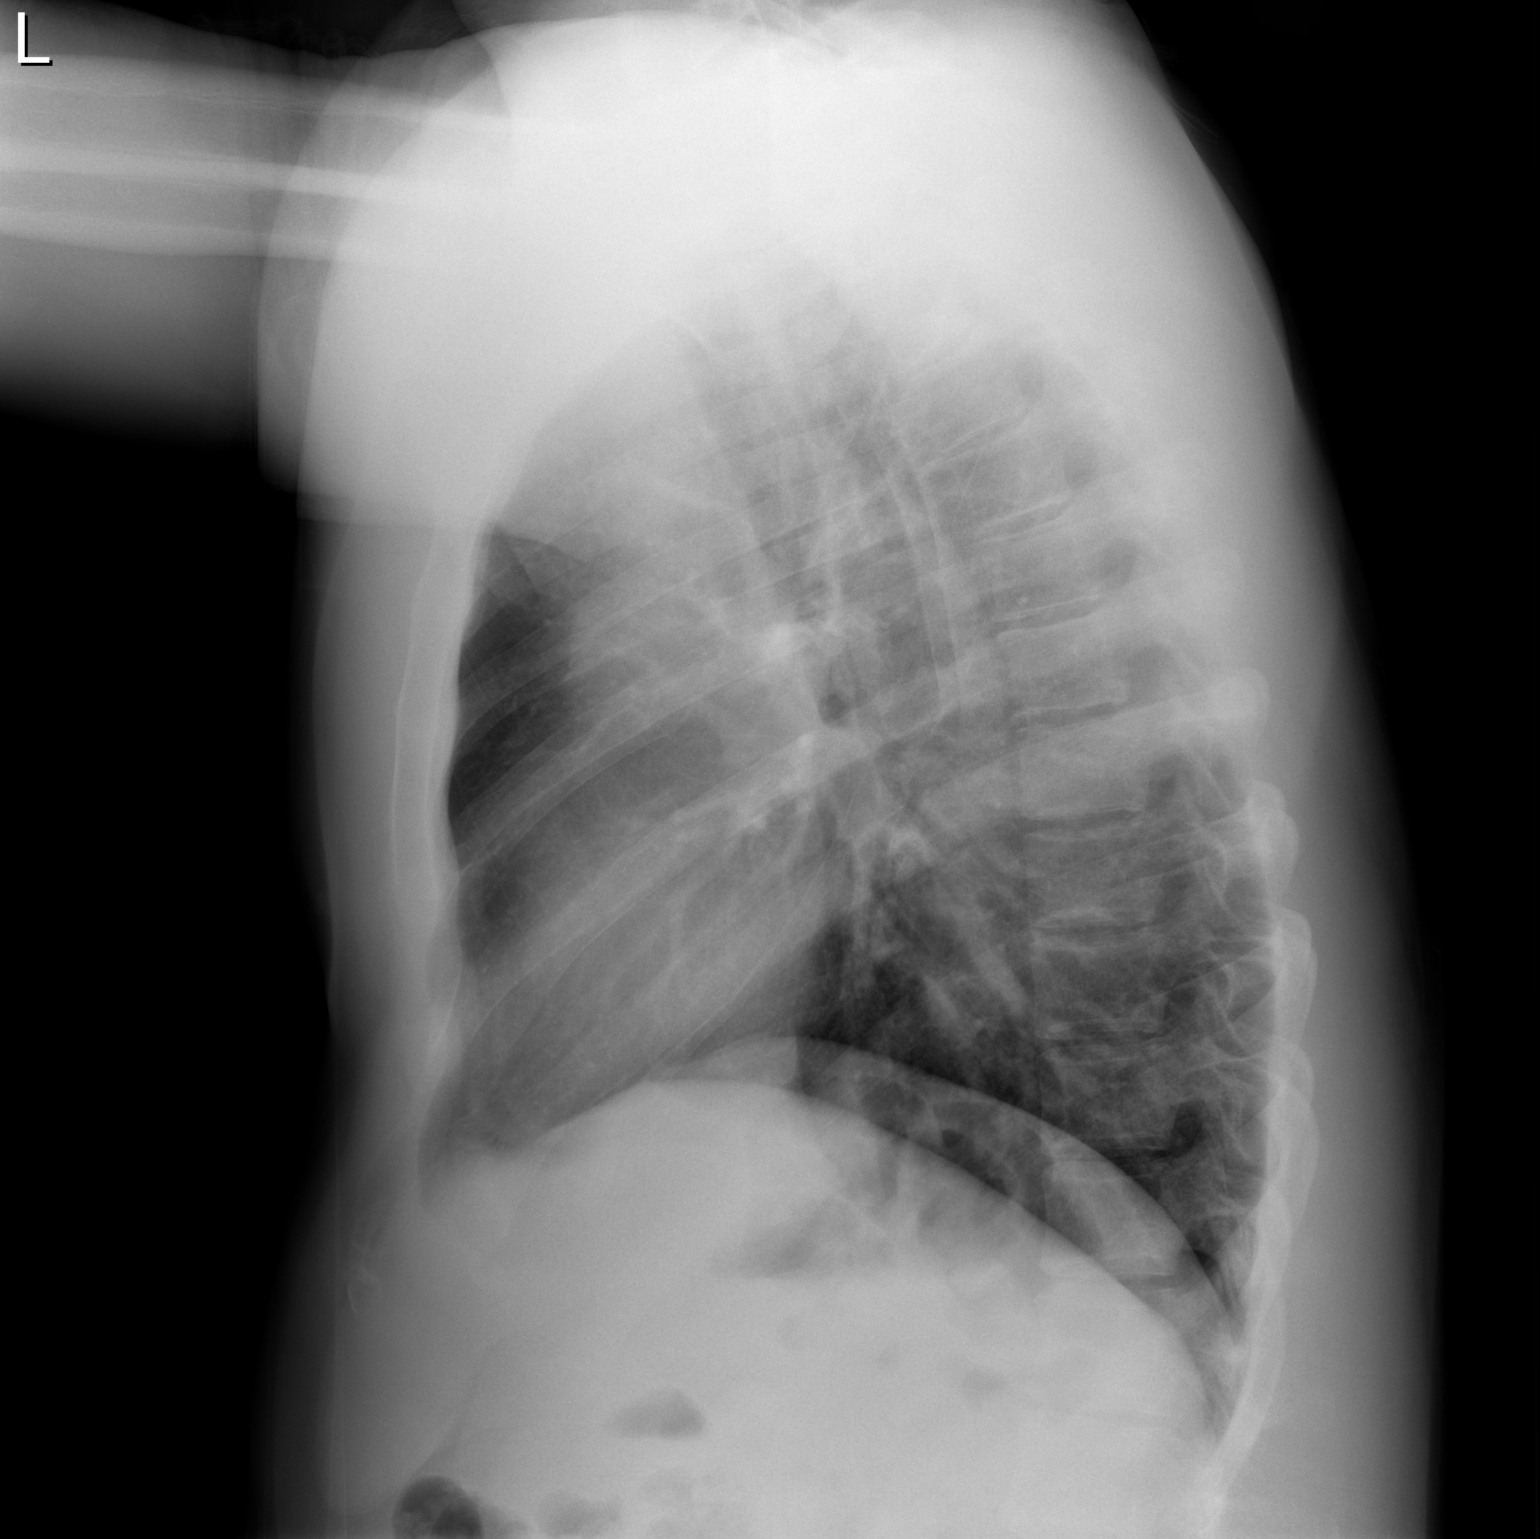

[2 of 2 positions shown; findings below may reference images not displayed]

FINDINGS: The heart size and mediastinal contours are within normal limits.
Both lungs are clear. The visualized skeletal structures are
unremarkable.
IMPRESSION: No active cardiopulmonary disease.

## 2022-07-04 ENCOUNTER — Emergency Department (HOSPITAL_BASED_OUTPATIENT_CLINIC_OR_DEPARTMENT_OTHER): Payer: BC Managed Care – PPO

## 2022-07-04 ENCOUNTER — Emergency Department (HOSPITAL_BASED_OUTPATIENT_CLINIC_OR_DEPARTMENT_OTHER)
Admission: EM | Admit: 2022-07-04 | Discharge: 2022-07-04 | Disposition: A | Discharge status: Home / Self Care | Payer: BC Managed Care – PPO | Attending: Emergency Medicine | Admitting: Emergency Medicine

## 2022-07-04 ENCOUNTER — Encounter (HOSPITAL_BASED_OUTPATIENT_CLINIC_OR_DEPARTMENT_OTHER): Payer: Self-pay | Admitting: Emergency Medicine

## 2022-07-04 ENCOUNTER — Other Ambulatory Visit: Payer: Self-pay

## 2022-07-04 DIAGNOSIS — J101 Influenza due to other identified influenza virus with other respiratory manifestations: Secondary | ICD-10-CM

## 2022-07-04 DIAGNOSIS — Z20822 Contact with and (suspected) exposure to covid-19: Secondary | ICD-10-CM | POA: Diagnosis not present

## 2022-07-04 DIAGNOSIS — R059 Cough, unspecified: Secondary | ICD-10-CM | POA: Diagnosis present

## 2022-07-04 LAB — RESP PANEL BY RT-PCR (RSV, FLU A&B, COVID)  RVPGX2
Influenza A by PCR: NEGATIVE
Influenza B by PCR: POSITIVE — AB
Resp Syncytial Virus by PCR: NEGATIVE
SARS Coronavirus 2 by RT PCR: NEGATIVE

## 2022-07-04 MED ORDER — GUAIFENESIN ER 1200 MG PO TB12: 1.0000 | ORAL_TABLET | Freq: Two times a day (BID) | ORAL | 0 refills | Status: AC

## 2022-07-04 MED ORDER — NAPROXEN 375 MG PO TABS: 375.0000 mg | ORAL_TABLET | Freq: Two times a day (BID) | ORAL | 0 refills | Status: AC

## 2022-07-04 MED ORDER — BENZONATATE 100 MG PO CAPS: 100.0000 mg | ORAL_CAPSULE | Freq: Three times a day (TID) | ORAL | 0 refills | Status: AC

## 2022-07-04 NOTE — ED Triage Notes (Signed)
Couigh, sob , congestions, rib pains. Runny nose Started Sunday Theraflu and mucinex at home with some symptoms improvement

## 2022-07-04 NOTE — ED Provider Notes (Signed)
[facility] Provider Note   CSN: [id number] Arrival date & time: 07/04/22  1804     History  Chief Complaint  Patient presents with   Cough    Jonathon Mendez is a 37 y.o. male.   Cough    Patient presents ED with complaints of cough shortness of breath congestions rib pains.  Patient states he started having bodyaches low-grade fevers.  Patient denies any trouble with vomiting or diarrhea.  Home Medications Prior to Admission medications   Medication Sig Start Date End Date Taking? Authorizing Provider  benzonatate (TESSALON) 100 MG capsule Take 1 capsule (100 mg total) by mouth every 8 (eight) hours. 07/04/22  Yes Dorie Rank, MD  Guaifenesin 1200 MG TB12 Take 1 tablet (1,200 mg total) by mouth 2 (two) times daily at 10 AM and 5 PM. 07/04/22  Yes Dorie Rank, MD  naproxen (NAPROSYN) 375 MG tablet Take 1 tablet (375 mg total) by mouth 2 (two) times daily. 07/04/22  Yes Dorie Rank, MD  buPROPion [facility] SR) 200 MG 12 hr tablet Take 1 tablet (200 mg total) by mouth 2 (two) times daily. 07/18/21 07/18/22  Dara Hoyer, PA-C  traZODone (DESYREL) 50 MG tablet Take 1 tablet (50 mg total) by mouth at bedtime and may repeat dose one time if needed. 06/13/21   Dara Hoyer, PA-C      Allergies    Patient has no known allergies.    Review of Systems   Review of Systems  Respiratory:  Positive for cough.     Physical Exam Updated Vital Signs BP 137/82 (BP Location: Right Arm)   Pulse (!) 102   Temp 99.9 F (37.7 C) (Oral)   Resp 18   SpO2 99%  Physical Exam Vitals and nursing note reviewed.  Constitutional:      General: He is not in acute distress.    Appearance: He is well-developed.  HENT:     Head: Normocephalic and atraumatic.     Right Ear: External ear normal.     Left Ear: External ear normal.     Nose: Congestion present.  Eyes:     General: No scleral icterus.       Right eye: No discharge.         Left eye: No discharge.     Conjunctiva/sclera: Conjunctivae normal.  Neck:     Trachea: No tracheal deviation.  Cardiovascular:     Rate and Rhythm: Normal rate and regular rhythm.  Pulmonary:     Effort: Pulmonary effort is normal. No respiratory distress.     Breath sounds: Normal breath sounds. No stridor. No wheezing or rales.  Abdominal:     General: Bowel sounds are normal. There is no distension.     Palpations: Abdomen is soft.     Tenderness: There is no abdominal tenderness. There is no guarding or rebound.  Musculoskeletal:        General: No tenderness or deformity.     Cervical back: Neck supple.  Skin:    General: Skin is warm and dry.     Findings: No rash.  Neurological:     General: No focal deficit present.     Mental Status: He is alert.     Cranial Nerves: No cranial nerve deficit, dysarthria or facial asymmetry.     Sensory: No sensory deficit.     Motor: No abnormal muscle tone or seizure activity.     Coordination: Coordination normal.  Psychiatric:        Mood and Affect: Mood normal.     ED Results / Procedures / Treatments   Labs (all labs ordered are listed, but only abnormal results are displayed) Labs Reviewed  RESP PANEL BY RT-PCR (RSV, FLU A&B, COVID)  RVPGX2 - Abnormal; Notable for the following components:      Result Value   Influenza B by PCR POSITIVE (*)    All other components within normal limits    EKG None  Radiology DG Chest Portable 1 View  Result Date: 07/04/2022 CLINICAL DATA:  Rib pain short of breath EXAM: PORTABLE CHEST 1 VIEW COMPARISON:  04/13/2020 FINDINGS: The heart size and mediastinal contours are within normal limits. Both lungs are clear. The visualized skeletal structures are unremarkable. IMPRESSION: No active disease. Electronically Signed   By: Donavan Foil M.D.   On: 07/04/2022 20:22    Procedures Procedures    Medications Ordered in ED Medications - No data to display  ED Course/ Medical Decision  Making/ A&P Clinical Course as of 07/04/22 2036  Tue Jul 04, 2022  2031 Chest x-ray without acute findings [JK]  2033 Influenza test positive [JK]    Clinical Course User Index [JK] Dorie Rank, MD                             Medical Decision Making Problems Addressed: Influenza B: acute illness or injury that poses a threat to life or bodily functions  Amount and/or Complexity of Data Reviewed Labs: ordered. Decision-making details documented in ED Course. Radiology: ordered and independent interpretation performed.   Patient presents to the ED for evaluation cough congestion.  ED workup notable for acute influenza.  Patient does not have pneumonia.  He is not having any respiratory difficulty.  No signs of having sepsis.  Will discharge home with symptomatic care.       Final Clinical Impression(s) / ED Diagnoses Final diagnoses:  Influenza B    Rx / DC Orders ED Discharge Orders          Ordered    benzonatate (TESSALON) 100 MG capsule  Every 8 hours        07/04/22 2033    Guaifenesin 1200 MG TB12  2 times daily        07/04/22 2033    naproxen (NAPROSYN) 375 MG tablet  2 times daily        07/04/22 2033              Dorie Rank, MD 07/04/22 2100

## 2022-07-04 NOTE — Discharge Instructions (Signed)
Take the medications to help with the cough and congestion associated with influenza.  You can also take over-the-counter medications such as ibuprofen or Tylenol.  Rest drink plenty of fluids.  Your symptoms should resolve over the week

## 2023-05-26 ENCOUNTER — Emergency Department (HOSPITAL_BASED_OUTPATIENT_CLINIC_OR_DEPARTMENT_OTHER)
Admission: EM | Admit: 2023-05-26 | Discharge: 2023-05-26 | Disposition: A | Discharge status: Home / Self Care | Payer: Self-pay | Attending: Emergency Medicine | Admitting: Emergency Medicine

## 2023-05-26 ENCOUNTER — Encounter (HOSPITAL_BASED_OUTPATIENT_CLINIC_OR_DEPARTMENT_OTHER): Payer: Self-pay

## 2023-05-26 DIAGNOSIS — H7391 Unspecified disorder of tympanic membrane, right ear: Secondary | ICD-10-CM | POA: Insufficient documentation

## 2023-05-26 MED ORDER — AMOXICILLIN 500 MG PO CAPS
500.0000 mg | ORAL_CAPSULE | Freq: Three times a day (TID) | ORAL | 0 refills | Status: AC
Start: 2023-05-26 — End: ?

## 2023-05-26 NOTE — ED Triage Notes (Signed)
 He c/o right ear pain x 2-3 days without fever or drainage.

## 2023-05-26 NOTE — Discharge Instructions (Signed)
 Take antibiotics as prescribed.  I however am concerned that you had an injury to your tympanic membrane, possibly from the Q-tip.  Make an appointment to follow-up with ENT for recheck.  Return to the emergency room if you have any worsening symptoms.

## 2023-05-26 NOTE — ED Notes (Signed)
 Pt discharged home after verbalizing understanding of discharge instructions; nad noted.

## 2023-05-26 NOTE — ED Provider Notes (Signed)
 [place] EMERGENCY DEPARTMENT AT [facility] Provider Note   CSN: [id number] Arrival date & time: 05/26/23  0730     History  Chief Complaint  Patient presents with   Otalgia    Jonathon Mendez is a 38 y.o. male.  Patient is a 38 year old male who presents with pain in his right ear for the last 2 days.  He has had some URI symptoms.  No drainage.  He does use a Q-tip to clean out his years and does not know if he had an injury from that.  He denies any change in his hearing.  No bleeding from the ear.       Home Medications Prior to Admission medications   Medication Sig Start Date End Date Taking? Authorizing Provider  amoxicillin  (AMOXIL ) 500 MG capsule Take 1 capsule (500 mg total) by mouth 3 (three) times daily. 05/26/23  Yes Lenor Hollering, MD  benzonatate  (TESSALON ) 100 MG capsule Take 1 capsule (100 mg total) by mouth every 8 (eight) hours. 07/04/22   Randol Simmonds, MD  buPROPion  (WELLBUTRIN  SR) 200 MG 12 hr tablet Take 1 tablet (200 mg total) by mouth 2 (two) times daily. 07/18/21 07/18/22  Kober, Charles E, PA-C  Guaifenesin  1200 MG TB12 Take 1 tablet (1,200 mg total) by mouth 2 (two) times daily at 10 AM and 5 PM. 07/04/22   Randol Simmonds, MD  naproxen  (NAPROSYN ) 375 MG tablet Take 1 tablet (375 mg total) by mouth 2 (two) times daily. 07/04/22   Randol Simmonds, MD  traZODone  (DESYREL ) 50 MG tablet Take 1 tablet (50 mg total) by mouth at bedtime and may repeat dose one time if needed. 06/13/21   Kober, Charles E, PA-C      Allergies    Patient has no known allergies.    Review of Systems   Review of Systems  Constitutional:  Negative for fatigue and fever.  HENT:  Positive for congestion, ear pain and rhinorrhea. Negative for facial swelling.   Respiratory:  Negative for shortness of breath.   Gastrointestinal:  Negative for nausea and vomiting.  Neurological:  Negative for headaches.    Physical Exam Updated Vital Signs BP 128/82 (BP Location: Right Arm)    Pulse 78   Temp 97.8 F (36.6 C) (Oral)   Resp 16   SpO2 100%  Physical Exam Constitutional:      Appearance: He is well-developed.  HENT:     Head: Normocephalic and atraumatic.     Left Ear: Tympanic membrane normal.     Ears:     Comments: There is some cloudy fluid behind the right TM.  There is a hematoma to the external aspect of the TM as it abuts the canal at the 2 to 3 o'clock position.  I do not see any definite rupture of the TM. Eyes:     Pupils: Pupils are equal, round, and reactive to light.  Cardiovascular:     Rate and Rhythm: Normal rate and regular rhythm.     Heart sounds: Normal heart sounds.  Pulmonary:     Effort: Pulmonary effort is normal. No respiratory distress.     Breath sounds: Normal breath sounds. No wheezing or rales.  Chest:     Chest wall: No tenderness.  Abdominal:     General: Bowel sounds are normal.     Palpations: Abdomen is soft.     Tenderness: There is no abdominal tenderness. There is no guarding or rebound.  Musculoskeletal:  General: Normal range of motion.     Cervical back: Normal range of motion and neck supple.  Lymphadenopathy:     Cervical: No cervical adenopathy.  Skin:    General: Skin is warm and dry.     Findings: No rash.  Neurological:     Mental Status: He is alert and oriented to person, place, and time.     ED Results / Procedures / Treatments   Labs (all labs ordered are listed, but only abnormal results are displayed) Labs Reviewed - No data to display  EKG None  Radiology No results found.  Procedures Procedures    Medications Ordered in ED Medications - No data to display  ED Course/ Medical Decision Making/ A&P                                 Medical Decision Making Risk Prescription drug management.   Patient presents with pain in his right ear.  He does have some cloudy fluid behind the TM associated with what appears to be a hematoma to the upper right corner of the TM.   Suspect this may be traumatic in nature given his use of Q-tips.  However given his effusion, will treat with amoxicillin .  Encouraged him to have follow-up with the ENT for reevaluation.  He was discharged home in good condition.  Return precautions were given.  Final Clinical Impression(s) / ED Diagnoses Final diagnoses:  Abnormal tympanic membrane of right ear    Rx / DC Orders ED Discharge Orders          Ordered    amoxicillin  (AMOXIL ) 500 MG capsule  3 times daily        05/26/23 0813              Lenor Hollering, MD 05/26/23 [phone]

## 2024-02-02 ENCOUNTER — Other Ambulatory Visit: Payer: Self-pay

## 2024-02-02 ENCOUNTER — Encounter (HOSPITAL_BASED_OUTPATIENT_CLINIC_OR_DEPARTMENT_OTHER): Payer: Self-pay

## 2024-02-02 ENCOUNTER — Emergency Department (HOSPITAL_BASED_OUTPATIENT_CLINIC_OR_DEPARTMENT_OTHER)
Admission: EM | Admit: 2024-02-02 | Discharge: 2024-02-02 | Disposition: A | Discharge status: Home / Self Care | Attending: Emergency Medicine | Admitting: Emergency Medicine

## 2024-02-02 DIAGNOSIS — J029 Acute pharyngitis, unspecified: Secondary | ICD-10-CM

## 2024-02-02 DIAGNOSIS — M791 Myalgia, unspecified site: Secondary | ICD-10-CM

## 2024-02-02 DIAGNOSIS — U071 COVID-19: Secondary | ICD-10-CM | POA: Diagnosis not present

## 2024-02-02 DIAGNOSIS — R051 Acute cough: Secondary | ICD-10-CM

## 2024-02-02 DIAGNOSIS — R059 Cough, unspecified: Secondary | ICD-10-CM | POA: Diagnosis present

## 2024-02-02 LAB — RESP PANEL BY RT-PCR (RSV, FLU A&B, COVID)  RVPGX2
Influenza A by PCR: NEGATIVE
Influenza B by PCR: NEGATIVE
Resp Syncytial Virus by PCR: NEGATIVE
SARS Coronavirus 2 by RT PCR: POSITIVE — AB

## 2024-02-02 NOTE — Discharge Instructions (Signed)
 As discussed, you tested positive for COVID which is most likely causing your symptoms.  Recommend use of allergies medicine such as Zyrtec/Claritin/Allegra, nasal spray such as Nasacort/Flonase, Tylenol /Motrin  for your symptoms.  Recommend follow-up to primary care for reassessment.  Please do not hesitate to return if the worrisome signs and symptoms discussed become apparent.

## 2024-02-02 NOTE — ED Triage Notes (Signed)
 Arrives POV with complaints of cough,  congestion, and headache x3 days.

## 2024-02-02 NOTE — ED Notes (Signed)
 RN reviewed discharge instructions with pt. Pt verbalized understanding and had no further questions. VSS upon discharge.

## 2024-02-02 NOTE — ED Provider Notes (Signed)
 [place] EMERGENCY DEPARTMENT AT [facility] Provider Note   CSN: [id number] Arrival date & time: 02/02/24  1502     Patient presents with: Cough and Nasal Congestion   Jonathon Mendez is a 38 y.o. male.    Cough   38 year old male presents emergency department with 2 days of cough, nasal congestion, body aches, sore throat.  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  Denies any known fever/chills.  Denies any chest pain, shortness of breath, abdominal pain, nausea, vomiting.  Presents due to concern for possible COVID.  No significant pertinent past medical history.  Prior to Admission medications   Medication Sig Start Date End Date Taking? Authorizing Provider  amoxicillin  (AMOXIL ) 500 MG capsule Take 1 capsule (500 mg total) by mouth 3 (three) times daily. 05/26/23   Lenor Hollering, MD  benzonatate  (TESSALON ) 100 MG capsule Take 1 capsule (100 mg total) by mouth every 8 (eight) hours. 07/04/22   Randol Simmonds, MD  buPROPion  (WELLBUTRIN  SR) 200 MG 12 hr tablet Take 1 tablet (200 mg total) by mouth 2 (two) times daily. 07/18/21 07/18/22  Kober, Charles E, PA-C  Guaifenesin  1200 MG TB12 Take 1 tablet (1,200 mg total) by mouth 2 (two) times daily at 10 AM and 5 PM. 07/04/22   Randol Simmonds, MD  naproxen  (NAPROSYN ) 375 MG tablet Take 1 tablet (375 mg total) by mouth 2 (two) times daily. 07/04/22   Randol Simmonds, MD  traZODone  (DESYREL ) 50 MG tablet Take 1 tablet (50 mg total) by mouth at bedtime and may repeat dose one time if needed. 06/13/21   Kober, Charles E, PA-C    Allergies: Patient has no known allergies.    Review of Systems  Respiratory:  Positive for cough.   All other systems reviewed and are negative.   Updated Vital Signs BP 132/86 (BP Location: Right Arm)   Pulse 98   Temp 97.8 F (36.6 C)   Resp 17   Ht 5' 9 (1.753 m)   Wt 95.3 kg   SpO2 100%   BMI 31.01 kg/m   Physical Exam Vitals and nursing note reviewed.  Constitutional:       General: He is not in acute distress.    Appearance: He is well-developed.  HENT:     Head: Normocephalic and atraumatic.     Nose:     Comments: Mild posterior pharyngeal erythema.  Uvula midline rises symmetrically phonation.  No sublingual or submandibular swelling.  No trismus.  No changes in phonation per patient. Eyes:     Conjunctiva/sclera: Conjunctivae normal.  Cardiovascular:     Rate and Rhythm: Normal rate and regular rhythm.     Heart sounds: No murmur heard. Pulmonary:     Effort: Pulmonary effort is normal. No respiratory distress.     Breath sounds: Normal breath sounds. No wheezing, rhonchi or rales.  Abdominal:     Palpations: Abdomen is soft.     Tenderness: There is no abdominal tenderness. There is no guarding.  Musculoskeletal:        General: No swelling.     Cervical back: Neck supple.  Skin:    General: Skin is warm and dry.     Capillary Refill: Capillary refill takes less than 2 seconds.  Neurological:     Mental Status: He is alert.  Psychiatric:        Mood and Affect: Mood normal.     (all labs ordered are listed, but only abnormal results are displayed) Labs Reviewed  RESP PANEL BY RT-PCR (RSV, FLU A&B, COVID)  RVPGX2 - Abnormal; Notable for the following components:      Result Value   SARS Coronavirus 2 by RT PCR POSITIVE (*)    All other components within normal limits    EKG: None  Radiology: No results found.   Procedures   Medications Ordered in the ED - No data to display                                  Medical Decision Making  This patient presents to the ED for concern of cough, body aches, sore throat, this involves an extensive number of treatment options, and is a complaint that carries with it a high risk of complications and morbidity.  The differential diagnosis includes COVID, flu, SVT, pneumonia, other   Co morbidities that complicate the patient evaluation  See HPI   Additional history  obtained:  Additional history obtained from EMR External records from outside source obtained and reviewed including hospital records   Lab Tests:  I Ordered, and personally interpreted labs.  The pertinent results include: COVID-positive   Imaging Studies ordered:  N/a   Cardiac Monitoring: / EKG:  The patient was maintained on a cardiac monitor.  I personally viewed and interpreted the cardiac monitored which showed an underlying rhythm of: Sinus rhythm   Consultations Obtained:  N/a   Problem List / ED Course / Critical interventions / Medication management  COVID, cough, myalgias, sore throat Reevaluation of the patient showed that the patient stayed the same I have reviewed the patients home medicines and have made adjustments as needed   Social Determinants of Health:  Chronic cigar use.  Denies illicit drug use.   Test / Admission - Considered:  COVID, cough, myalgias, sore throat Vitals signs within normal range and stable throughout visit. Laboratory/imaging studies significant for: See above 38 year old male presents emergency department with 2 days of cough, nasal congestion, body aches, sore throat.  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  Denies any known fever/chills.  Denies any chest pain, shortness of breath, abdominal pain, nausea, vomiting.  Presents due to concern for possible COVID. On exam, lungs clear to oscillation bilaterally.  Abdomen nontender.  Nasal congestion/rhinorrhea present as well as mild posterior pharyngeal erythema.  Given no obvious adventitious breath sounds auscultated, low suspicion clinically for pneumonia.  Patient did have viral testing by triage staff which was positive for COVID.  Suspect this is most likely causing symptoms.  Will recommend symptomatic therapy as in AVS and close follow-up with primary care in the outpatient setting for reassessment.  Treatment plan discussed with patient he is understanding was  agreeable.  Patient well-appearing, afebrile in no acute distress. Worrisome signs and symptoms were discussed with the patient, and the patient acknowledged understanding to return to the ED if noticed. Patient was stable upon discharge.       Final diagnoses:  None    ED Discharge Orders     None          Silver Wonda LABOR, [place] 02/02/24 1732    Yolande Lamar BROCKS, MD 02/03/24 [phone]
